# Patient Record
Sex: Male | Born: 1965 | Race: White | Hispanic: No | Marital: Married | Smoking: Current some day smoker
Health system: Southern US, Community
[De-identification: ages and names within clinical notes are randomized; demographics above are authoritative.]

## PROBLEM LIST (undated history)

## (undated) DIAGNOSIS — K611 Rectal abscess: Secondary | ICD-10-CM

## (undated) DIAGNOSIS — M1990 Unspecified osteoarthritis, unspecified site: Secondary | ICD-10-CM

## (undated) DIAGNOSIS — J189 Pneumonia, unspecified organism: Secondary | ICD-10-CM

## (undated) DIAGNOSIS — I10 Essential (primary) hypertension: Secondary | ICD-10-CM

## (undated) DIAGNOSIS — IMO0002 Reserved for concepts with insufficient information to code with codable children: Secondary | ICD-10-CM

## (undated) DIAGNOSIS — E785 Hyperlipidemia, unspecified: Secondary | ICD-10-CM

## (undated) DIAGNOSIS — I499 Cardiac arrhythmia, unspecified: Secondary | ICD-10-CM

## (undated) HISTORY — DX: Reserved for concepts with insufficient information to code with codable children: IMO0002

## (undated) HISTORY — PX: BACK SURGERY: SHX140

## (undated) HISTORY — DX: Rectal abscess: K61.1

## (undated) HISTORY — DX: Essential (primary) hypertension: I10

## (undated) HISTORY — PX: TONSILLECTOMY: SUR1361

## (undated) HISTORY — DX: Hyperlipidemia, unspecified: E78.5

---

## 1998-02-21 ENCOUNTER — Encounter: Admission: RE | Admit: 1998-02-21 | Discharge: 1998-03-13 | Payer: Self-pay | Admitting: Specialist

## 1998-03-23 ENCOUNTER — Encounter: Payer: Self-pay | Admitting: Specialist

## 1998-03-23 ENCOUNTER — Ambulatory Visit (HOSPITAL_COMMUNITY): Admission: RE | Admit: 1998-03-23 | Discharge: 1998-03-23 | Payer: Self-pay | Admitting: Specialist

## 1999-05-09 ENCOUNTER — Ambulatory Visit (HOSPITAL_COMMUNITY): Admission: RE | Admit: 1999-05-09 | Discharge: 1999-05-09 | Payer: Self-pay

## 2010-07-08 ENCOUNTER — Encounter (INDEPENDENT_AMBULATORY_CARE_PROVIDER_SITE_OTHER): Payer: Self-pay | Admitting: General Surgery

## 2010-07-08 ENCOUNTER — Ambulatory Visit (INDEPENDENT_AMBULATORY_CARE_PROVIDER_SITE_OTHER): Payer: 59 | Admitting: General Surgery

## 2010-07-08 DIAGNOSIS — L03319 Cellulitis of trunk, unspecified: Secondary | ICD-10-CM

## 2010-07-08 DIAGNOSIS — Z789 Other specified health status: Secondary | ICD-10-CM

## 2010-07-08 DIAGNOSIS — R0989 Other specified symptoms and signs involving the circulatory and respiratory systems: Secondary | ICD-10-CM | POA: Insufficient documentation

## 2010-07-08 DIAGNOSIS — K625 Hemorrhage of anus and rectum: Secondary | ICD-10-CM

## 2010-07-08 DIAGNOSIS — L02215 Cutaneous abscess of perineum: Secondary | ICD-10-CM

## 2010-07-08 DIAGNOSIS — I10 Essential (primary) hypertension: Secondary | ICD-10-CM | POA: Insufficient documentation

## 2010-07-08 DIAGNOSIS — Z973 Presence of spectacles and contact lenses: Secondary | ICD-10-CM

## 2010-07-08 DIAGNOSIS — I999 Unspecified disorder of circulatory system: Secondary | ICD-10-CM

## 2010-07-08 NOTE — Patient Instructions (Signed)
Warm tub soaks. Wash area and keep as clean and dry as possible. Take antibiotic twice a day.

## 2010-07-08 NOTE — Progress Notes (Signed)
Subjective:     Patient ID: Wesley Harrington, male   DOB: 1966/01/01, 45 y.o.   MRN: [id number]    BP 148/96  Pulse 72  Temp(Src) 97 F (36.1 C) (Temporal)  Ht 6\' 4"  (1.93 m)  Wt 269 lb (122.018 kg)  BMI 32.74 kg/m2    HPI Patient is a 45 yo Male who has been experiencing pain in his right perineal area since last Monday. No fever. Little drainage.  Review of Systems Past Medical History  Diagnosis Date  . Hypertension   History reviewed. No pertinent past surgical history. Current outpatient prescriptions:amLODipine (NORVASC) 5 MG tablet, Take 5 mg by mouth daily.  , Disp: , Rfl: ;  fish oil-omega-3 fatty acids 1000 MG capsule, Take 2 g by mouth daily.  , Disp: , Rfl: ;  Multiple Vitamin (MULTIVITAMIN) tablet, Take 1 tablet by mouth daily. Patient stated he is taking "provochol". Was not listed in database. , Disp: , Rfl:  Allergies  Allergen Reactions  . Sulfur Other (See Comments)    Dropped body temp. Felt cold.   History   Social History  . Marital Status: Single    Spouse Name: N/A    Number of Children: N/A  . Years of Education: N/A   Occupational History  . Not on file.   Social History Main Topics  . Smoking status: Current Everyday Smoker -- 1.5 packs/day  . Smokeless tobacco: Not on file  . Alcohol Use: Yes     socially 4 - 5 beers  . Drug Use: Yes    Special: Marijuana  . Sexually Active: Not on file   Other Topics Concern  . Not on file   Social History Narrative  . No narrative on file   Family History  Problem Relation Age of Onset  . Cancer Mother     skin  . Heart disease Father     congestive heart failure  . Other Brother     pacemaker       Objective:   Physical Exam He has a hard indurated area in his right perineal area. No redness. No drainage. No fluctuance. Exam otherwise neg.    Assessment:     Possible infection in right perineum.     Plan:     Start doxycycline. F/U in 1 week

## 2010-07-09 ENCOUNTER — Encounter (INDEPENDENT_AMBULATORY_CARE_PROVIDER_SITE_OTHER): Payer: Self-pay | Admitting: General Surgery

## 2010-07-17 ENCOUNTER — Ambulatory Visit (INDEPENDENT_AMBULATORY_CARE_PROVIDER_SITE_OTHER): Payer: 59 | Admitting: General Surgery

## 2010-07-17 ENCOUNTER — Encounter (INDEPENDENT_AMBULATORY_CARE_PROVIDER_SITE_OTHER): Payer: Self-pay | Admitting: General Surgery

## 2010-07-17 DIAGNOSIS — L02219 Cutaneous abscess of trunk, unspecified: Secondary | ICD-10-CM

## 2010-07-17 DIAGNOSIS — L02215 Cutaneous abscess of perineum: Secondary | ICD-10-CM

## 2010-07-17 NOTE — Progress Notes (Signed)
Subjective:     Patient ID: Wesley Harrington, male   DOB: October 15, 1965, 45 y.o.   MRN: [id number]    There were no vitals taken for this visit.    HPI The patient is a 45 her old white male who we have seen recently for any abscess in his right perineal area. He feels much better than he did his last visit. He has been on some doxycycline. He will intermittently get some drainage from the area. He denies any fevers. Review of Systems     Objective:   Physical Exam On exam he continues to have a palpable area of induration in his right perineal area headed towards the base of the scrotum. There is a punctate opening at the posterior edge of this. There is no drainage today.    Assessment:     Chronic sinus tract intermittently infected.    Plan:     I think this cycle will continue until the sinus tract is opened and drained more definitively. He would like to try to make it to a cooler part of the year because he is a heavy sweater. He is concerned about wound healing because of this. We will try to accommodate him. If he has another flareup though we may need to go ahead and open and drain it. I have discussed this with him including the risks of benefits of surgery as well as some of the technical aspects and he understands and would like to try to wait if he can. I will see him back in one month for a wound check.

## 2010-07-17 NOTE — Patient Instructions (Signed)
Keep area clean and dry Call if it flares up again F/U in 1 month Try to get to cooler time of year for surgery

## 2010-08-04 ENCOUNTER — Other Ambulatory Visit (INDEPENDENT_AMBULATORY_CARE_PROVIDER_SITE_OTHER): Payer: Self-pay | Admitting: General Surgery

## 2010-08-04 NOTE — Telephone Encounter (Signed)
fAX REQ RECEIVED FROM [facility]/ [phone] FOR DOXYCYCLINE  REFILL/ OK PER T.O. DR. P. TOTH/ [facility]

## 2010-08-20 ENCOUNTER — Encounter (INDEPENDENT_AMBULATORY_CARE_PROVIDER_SITE_OTHER): Payer: Self-pay | Admitting: General Surgery

## 2010-08-21 ENCOUNTER — Encounter (INDEPENDENT_AMBULATORY_CARE_PROVIDER_SITE_OTHER): Payer: 59 | Admitting: General Surgery

## 2010-10-06 ENCOUNTER — Encounter (INDEPENDENT_AMBULATORY_CARE_PROVIDER_SITE_OTHER): Payer: 59 | Admitting: General Surgery

## 2010-10-22 ENCOUNTER — Encounter (HOSPITAL_COMMUNITY)
Admission: RE | Admit: 2010-10-22 | Discharge: 2010-10-22 | Disposition: A | Discharge status: Home / Self Care | Payer: 59 | Source: Ambulatory Visit | Attending: General Surgery | Admitting: General Surgery

## 2010-10-22 ENCOUNTER — Other Ambulatory Visit (INDEPENDENT_AMBULATORY_CARE_PROVIDER_SITE_OTHER): Payer: Self-pay | Admitting: General Surgery

## 2010-10-22 DIAGNOSIS — K611 Rectal abscess: Secondary | ICD-10-CM

## 2010-10-22 LAB — BASIC METABOLIC PANEL
BUN: 14 mg/dL (ref 6–23)
CO2: 30 mEq/L (ref 19–32)
Calcium: 10.1 mg/dL (ref 8.4–10.5)
Chloride: 102 mEq/L (ref 96–112)
Creatinine, Ser: 1.01 mg/dL (ref 0.50–1.35)
GFR calc Af Amer: >90 mL/min (ref >90)
GFR calc non Af Amer: 87 mL/min — ABNORMAL LOW (ref >90)
Glucose, Bld: 109 mg/dL — ABNORMAL HIGH (ref 70–99)
Potassium: 4.9 mEq/L (ref 3.5–5.1)
Sodium: 141 mEq/L (ref 135–145)

## 2010-10-22 LAB — CBC
HCT: 44.8 % (ref 39.0–52.0)
Hemoglobin: 14.7 g/dL (ref 13.0–17.0)
MCH: 31.1 pg (ref 26.0–34.0)
MCHC: 32.8 g/dL (ref 30.0–36.0)
MCV: 94.9 fL (ref 78.0–100.0)
Platelets: 196 10*3/uL (ref 150–400)
RBC: 4.72 MIL/uL (ref 4.22–5.81)
RDW: 13.6 % (ref 11.5–15.5)
WBC: 7.6 10*3/uL (ref 4.0–10.5)

## 2010-10-22 LAB — SURGICAL PCR SCREEN: MRSA, PCR: NEGATIVE

## 2010-10-31 ENCOUNTER — Telehealth (INDEPENDENT_AMBULATORY_CARE_PROVIDER_SITE_OTHER): Payer: Self-pay | Admitting: General Surgery

## 2010-10-31 ENCOUNTER — Ambulatory Visit (HOSPITAL_COMMUNITY)
Admission: RE | Admit: 2010-10-31 | Discharge: 2010-10-31 | Disposition: A | Discharge status: Home / Self Care | Payer: 59 | Source: Ambulatory Visit | Attending: General Surgery | Admitting: General Surgery

## 2010-10-31 ENCOUNTER — Other Ambulatory Visit (INDEPENDENT_AMBULATORY_CARE_PROVIDER_SITE_OTHER): Payer: Self-pay | Admitting: General Surgery

## 2010-10-31 DIAGNOSIS — Z01818 Encounter for other preprocedural examination: Secondary | ICD-10-CM | POA: Insufficient documentation

## 2010-10-31 DIAGNOSIS — Z01812 Encounter for preprocedural laboratory examination: Secondary | ICD-10-CM | POA: Insufficient documentation

## 2010-10-31 DIAGNOSIS — K219 Gastro-esophageal reflux disease without esophagitis: Secondary | ICD-10-CM | POA: Insufficient documentation

## 2010-10-31 DIAGNOSIS — E669 Obesity, unspecified: Secondary | ICD-10-CM | POA: Insufficient documentation

## 2010-10-31 DIAGNOSIS — L02219 Cutaneous abscess of trunk, unspecified: Secondary | ICD-10-CM | POA: Insufficient documentation

## 2010-10-31 DIAGNOSIS — K612 Anorectal abscess: Secondary | ICD-10-CM

## 2010-10-31 DIAGNOSIS — I10 Essential (primary) hypertension: Secondary | ICD-10-CM | POA: Insufficient documentation

## 2010-10-31 DIAGNOSIS — Z01810 Encounter for preprocedural cardiovascular examination: Secondary | ICD-10-CM | POA: Insufficient documentation

## 2010-10-31 DIAGNOSIS — F17200 Nicotine dependence, unspecified, uncomplicated: Secondary | ICD-10-CM | POA: Insufficient documentation

## 2010-10-31 HISTORY — PX: INCISE AND DRAIN ABCESS: PRO64

## 2010-10-31 NOTE — Telephone Encounter (Signed)
Mrs. Cicero called and stated that Wesley Harrington was taking Tylox for pain after rectal surgery today and it made him itch.  I advised her to give him Benadryl and I called [facility] [address].  I also told her she could apply an ice pack to the area for 24 hours.

## 2010-11-02 ENCOUNTER — Telehealth (INDEPENDENT_AMBULATORY_CARE_PROVIDER_SITE_OTHER): Payer: Self-pay | Admitting: General Surgery

## 2010-11-02 NOTE — Telephone Encounter (Signed)
Mrs. Wesley Harrington called again.  Mr. Wesley Harrington is still having problems with pain.  I told her to add Ibuprofen to his regiman.

## 2010-11-03 ENCOUNTER — Telehealth (INDEPENDENT_AMBULATORY_CARE_PROVIDER_SITE_OTHER): Payer: Self-pay | Admitting: General Surgery

## 2010-11-03 ENCOUNTER — Telehealth (INDEPENDENT_AMBULATORY_CARE_PROVIDER_SITE_OTHER): Payer: Self-pay

## 2010-11-03 NOTE — Telephone Encounter (Signed)
Ok for tylox but he will probably have to come get prescription

## 2010-11-03 NOTE — Telephone Encounter (Signed)
Pt 's wife called in wanting to request home health care to be extended longer than the 5days that [facility] has allowed for the teaching of the dressing changes. I did advise that usually the health insurance is the one not allowing the home health to be out there longer than the family can be taught. I tried to explain to the pt's wife that as long as there is a family member to be taught in the home that [facility] will not go out there the whole entire time there is a wound. The pt' s wife tried to explain about her health insurance and what she is allowed but I told her that I would let her talk to Dr Billey Chang nurse. I transferred her to [employer].Hulda Humphrey

## 2010-11-03 NOTE — Telephone Encounter (Signed)
Pain medicine written per Dr Carolynne Edouard. #60. Patient to pick up in office.

## 2010-11-03 NOTE — Telephone Encounter (Signed)
Patient's wife called asking for an extension on her husbands skilled nursing visits for wound care. They are teaching her to do dressing changes but she feels like the wounds need skilled nursing. Last day for home health is Wednesday. Please advise.

## 2010-11-03 NOTE — Telephone Encounter (Signed)
Faxed request to extend visits for two more weeks to [facility]. Patient made aware.

## 2010-11-03 NOTE — Telephone Encounter (Signed)
i agree. Extend visits for a couple more weeks

## 2010-11-03 NOTE — Telephone Encounter (Signed)
Patient requesting refill of Tylox. I offered Vicodin per protocol but requesting Tylox. Please advise.

## 2010-11-10 ENCOUNTER — Ambulatory Visit (INDEPENDENT_AMBULATORY_CARE_PROVIDER_SITE_OTHER): Payer: 59 | Admitting: General Surgery

## 2010-11-10 ENCOUNTER — Encounter (INDEPENDENT_AMBULATORY_CARE_PROVIDER_SITE_OTHER): Payer: Self-pay | Admitting: General Surgery

## 2010-11-10 VITALS — BP 160/90 | HR 64 | Temp 98.2°F | Resp 12 | Ht 75.0 in | Wt 270.0 lb

## 2010-11-10 DIAGNOSIS — L02219 Cutaneous abscess of trunk, unspecified: Secondary | ICD-10-CM

## 2010-11-10 DIAGNOSIS — L02215 Cutaneous abscess of perineum: Secondary | ICD-10-CM | POA: Insufficient documentation

## 2010-11-10 NOTE — Patient Instructions (Signed)
Keep showering and changing dressing daily

## 2010-11-10 NOTE — Op Note (Signed)
  NAME:  Wesley Harrington, Wesley Harrington NO.:  [id number]  MEDICAL RECORD NO.:  [id number]  LOCATION:  [facility]                         FACILITY:  [facility]  PHYSICIAN:  Ollen Gross. Vernell Morgans, M.D. DATE OF BIRTH:  [birth year]  DATE OF PROCEDURE:  10/31/2010 DATE OF DISCHARGE:                              OPERATIVE REPORT   PREOPERATIVE DIAGNOSIS:  Perineal abscess tracks.  POSTOPERATIVE DIAGNOSIS:  Perineal abscess tracks.  PROCEDURE:  Incision and drainage of 3 perineal abscess tracks.  SURGEON:  Ollen Gross. Vernell Morgans, MD  ANESTHESIA:  General via LMA.  PROCEDURE:  After informed consent was obtained, the patient was brought to the operating room, placed in a supine position on the operating table.  After adequate induction of general anesthesia, the patient was moved into a lithotomy position.  His perineal area was then prepped with Betadine and draped in usual sterile manner.  The patient had 3 areas in question, 2 were on either side of the mid scrotum area, each of these were probed with a small silver probe until the abscess cavity was identified, the abscess cavity was then incised in a linear fashion. With the electrocautery, the purulence was evacuated.  The granulation tissue was fulgurated with the cautery until the wound was clean.  Each of these wounds were then infiltrated with 0.25% Marcaine and packed with gauze.  The third perineal tract started at the edge of the rectum, it was shallow, I went to the sphincter muscles and went up the right perineal area towards the base of the scrotum.  This was also probed with a small silver probe to identify the cavity.  The wound was then opened with a 15-blade knife and the electrocautery.  Very small amount of purulence was evacuated.  The granulation tissue was fulgurated with electrocautery.  Hemostasis was also achieved using the Bovie electrocautery.  Once the wound was clean, it was also infiltrated with 0.25% Marcaine and  packed with moistened 4 x 4 gauze.  Sterile dressings were then applied.  No other sinus tracts or abscess cavities were appreciated.  The patient tolerated the procedure well.  At the end of the case, all needle, sponge, and instrument counts were correct.  The patient was then awakened and taken to the recovery room in a stable condition.     Ollen Gross. Vernell Morgans, M.D.     PST/MEDQ  D:  10/31/2010  T:  10/31/2010  Job:  [id number]  Electronically Signed by Chevis Pretty III M.D. on 11/10/2010 08:11:40 AM

## 2010-11-11 ENCOUNTER — Encounter (INDEPENDENT_AMBULATORY_CARE_PROVIDER_SITE_OTHER): Payer: Self-pay | Admitting: General Surgery

## 2010-11-11 NOTE — Progress Notes (Signed)
Subjective:     Patient ID: Wesley Harrington, male   DOB: March 12, 1965, 45 y.o.   MRN: [id number]  HPI The patient is a 45 her white male who is now a couple weeks out from incision and drainage of 3 sinus tracts in his perineum. He appears very anxious and complains of some pain at the operative sites. He has been doing the dressing changes with the nurses and things seem to be going well  Review of Systems     Objective:   Physical Exam On exam all 3 wounds are open and very clean. We redressed the wound staying tolerated this well.    Assessment:     2 weeks status post incision and drainage of 3 sinus tracts in the perineum    Plan:     At this point I would like to continue to shower daily and irrigate the wounds. He'll continue daily dressing changes. We'll plan to see him back in another 2 weeks for a wound check

## 2010-11-17 ENCOUNTER — Telehealth (INDEPENDENT_AMBULATORY_CARE_PROVIDER_SITE_OTHER): Payer: Self-pay | Admitting: General Surgery

## 2010-11-17 NOTE — Telephone Encounter (Signed)
PT CALLED FOR REFILL OF TYLOX #60. DR/ TOTH WROTE RX AND PRESCRIPTION AT FRONT FOR PICK-UP. PT AWARE

## 2010-11-17 NOTE — Telephone Encounter (Signed)
PT CALLED FOR REFILL OF PAIN MEDICATION/ TYLOX. DR. Carolynne Edouard WROTE RX TODAY AND ORIGINAL AT FRONT DESK FOR PICK-UP. PT NOTIFIED

## 2010-11-24 ENCOUNTER — Encounter (INDEPENDENT_AMBULATORY_CARE_PROVIDER_SITE_OTHER): Payer: 59 | Admitting: General Surgery

## 2010-11-24 ENCOUNTER — Telehealth (INDEPENDENT_AMBULATORY_CARE_PROVIDER_SITE_OTHER): Payer: Self-pay

## 2010-11-24 NOTE — Telephone Encounter (Signed)
Pt called in concerned that he has a new area on his inner thigh that may be infected.  He has a red, swollen lump.  He had surgery in October for 3 areas and is coming in Wednesday to see Dr Carolynne Edouard.  He had been on Doxycycline for a while.  I paged Dr Carolynne Edouard and he wants him back on Doxycycline and he will check him on Wednesday.   I notified the pt and [facility] [phone] for 2 weeks.

## 2010-11-26 ENCOUNTER — Ambulatory Visit (INDEPENDENT_AMBULATORY_CARE_PROVIDER_SITE_OTHER): Payer: 59 | Admitting: General Surgery

## 2010-11-26 ENCOUNTER — Encounter (INDEPENDENT_AMBULATORY_CARE_PROVIDER_SITE_OTHER): Payer: Self-pay | Admitting: General Surgery

## 2010-11-26 VITALS — BP 144/102 | HR 68 | Temp 96.7°F | Resp 20 | Ht 72.0 in | Wt 264.1 lb

## 2010-11-26 DIAGNOSIS — L02215 Cutaneous abscess of perineum: Secondary | ICD-10-CM

## 2010-11-26 DIAGNOSIS — L03319 Cellulitis of trunk, unspecified: Secondary | ICD-10-CM

## 2010-11-26 MED ORDER — LINEZOLID 600 MG PO TABS: 600.0000 mg | ORAL_TABLET | Freq: Two times a day (BID) | ORAL | Status: AC

## 2010-11-26 NOTE — Patient Instructions (Signed)
Continue to shower and change dressing twice a day Start Zyvox

## 2010-11-27 ENCOUNTER — Encounter (INDEPENDENT_AMBULATORY_CARE_PROVIDER_SITE_OTHER): Payer: Self-pay | Admitting: General Surgery

## 2010-11-27 NOTE — Progress Notes (Signed)
Subjective:     Patient ID: Wesley Harrington, male   DOB: 1965/08/19, 45 y.o.   MRN: [id number]  HPI The patient is a 65 her a white male who is now about a month out from an incision and drainage of multiple perirectal abscesses. He has been doing dressing changes to the open areas. And he seemed to be improving. He still is on a lot of pain. Since his last visit he has started to develop a new area on the left inner thigh. This areas had no drainage. He denies any fevers or chills.  Review of Systems     Objective:   Physical Exam On exam of the 3 open areas are very clean and much more shallow than they have been. They are probably not deep enough to pack anymore. The new area is a little bit red with some induration.    Assessment:     One status post incision and drainage of multiple recurrent perirectal abscesses .    Plan:     At this point I would like him to continue to shower daily and place clean gauze over the wounds as needed. We started him on doxycycline for the new area over the last couple days. He has been on doxycycline quite a bit over the last year or so. We may try to switch him to something like Zyvox which would be potentially a better medicine against MRSA. We will see him back in 2 weeks.

## 2010-12-03 ENCOUNTER — Telehealth (INDEPENDENT_AMBULATORY_CARE_PROVIDER_SITE_OTHER): Payer: Self-pay | Admitting: General Surgery

## 2010-12-03 NOTE — Telephone Encounter (Signed)
MESSAGE RECEIVED IN IN-BASKET RE PT REQUEST FOR TYLOX REFILL FROM DR. TOTH. DR. Carolynne Edouard WROTE TYLOX #50 RX AND ORIGINAL AT FRONT DESK FOR PICK-UP. PT NOTIFIED.HE HAS F/U APPT WITH DR. TOTH ON 12-09-10

## 2010-12-03 NOTE — Telephone Encounter (Signed)
DR. Carolynne Edouard WROTE RX FOR TYLOX #50/ AT FRONT DESK FOR PT PICK-UP. PT NOTIFIED. HE HAS F/U WITH DR. TOTH ON 12-09-10.

## 2010-12-03 NOTE — Telephone Encounter (Signed)
Patient asking for a refill of his Tylox status post perineal abscess. Please advise.

## 2010-12-08 NOTE — Telephone Encounter (Signed)
He may have another prescription

## 2010-12-09 ENCOUNTER — Ambulatory Visit (INDEPENDENT_AMBULATORY_CARE_PROVIDER_SITE_OTHER): Payer: 59 | Admitting: General Surgery

## 2010-12-09 ENCOUNTER — Encounter (INDEPENDENT_AMBULATORY_CARE_PROVIDER_SITE_OTHER): Payer: Self-pay | Admitting: General Surgery

## 2010-12-09 VITALS — BP 136/88 | HR 76 | Temp 97.8°F | Resp 16 | Ht 75.0 in | Wt 267.8 lb

## 2010-12-09 DIAGNOSIS — L02215 Cutaneous abscess of perineum: Secondary | ICD-10-CM

## 2010-12-09 DIAGNOSIS — L02219 Cutaneous abscess of trunk, unspecified: Secondary | ICD-10-CM

## 2010-12-09 NOTE — Patient Instructions (Signed)
Continue to shower and keep area clean and dry

## 2010-12-09 NOTE — Progress Notes (Signed)
Subjective:     Patient ID: Wesley Harrington, male   DOB: 22-Feb-1965, 45 y.o.   MRN: [id number]  HPI The patient is a 26 her old white male who is several weeks out now from an incision and drainage of multiple perineal abscesses. He has been doing dressing changes to these areas and they all seem to be improving. He still complains of a lot of pain from the wound the nearest the rectum.  Review of Systems     Objective:   Physical Exam On exam all the wounds are very clean. They are all healing nicely. The 2 upper wounds are almost completely healed. The lower wound it is so much flatter and beginning to epithelialize.    Assessment:     Several weeks status post incision and drainage of multiple perineal abscesses    Plan:     At this point he will continue to keep the areas clean and dry. I think it would be fine for him to begin to return to work next week. We will see him back in about 3 weeks for another wound check

## 2010-12-23 ENCOUNTER — Ambulatory Visit (INDEPENDENT_AMBULATORY_CARE_PROVIDER_SITE_OTHER): Payer: 59 | Admitting: General Surgery

## 2010-12-23 ENCOUNTER — Encounter (INDEPENDENT_AMBULATORY_CARE_PROVIDER_SITE_OTHER): Payer: Self-pay | Admitting: General Surgery

## 2010-12-23 VITALS — BP 152/98 | HR 76 | Temp 97.7°F | Resp 16 | Ht 75.0 in | Wt 263.0 lb

## 2010-12-23 DIAGNOSIS — L02215 Cutaneous abscess of perineum: Secondary | ICD-10-CM

## 2010-12-23 DIAGNOSIS — L02219 Cutaneous abscess of trunk, unspecified: Secondary | ICD-10-CM

## 2010-12-23 NOTE — Patient Instructions (Signed)
Shower with hibiclens once a week

## 2010-12-30 ENCOUNTER — Encounter (INDEPENDENT_AMBULATORY_CARE_PROVIDER_SITE_OTHER): Payer: Self-pay | Admitting: General Surgery

## 2010-12-30 NOTE — Progress Notes (Signed)
Subjective:     Patient ID: Wesley Harrington, male   DOB: September 05, 1965, 45 y.o.   MRN: [id number]  HPI The patient's pain is gradually improving. He is several weeks out from incision and drainage of 3 perineal abscesses. He had a new spot, open his left groin area. It was very small and he has been on doxycycline for the last week or 2 and this area seems to have resolved.  Review of Systems     Objective:   Physical Exam On exam the wounds in his groin and perineal area have pretty much completely healed. There is no sign of infection    Assessment:     Several weeks out from incision and drainage of 3 perineal abscesses.    Plan:     Overall he looks good. I think at this point he should just keep these areas as clean as possible. We will plan to see him back in the next couple months just to check on his progress. He is to call if he has any problems in the meantime.

## 2011-01-27 ENCOUNTER — Encounter (INDEPENDENT_AMBULATORY_CARE_PROVIDER_SITE_OTHER): Payer: 59 | Admitting: General Surgery

## 2017-10-09 ENCOUNTER — Encounter (HOSPITAL_BASED_OUTPATIENT_CLINIC_OR_DEPARTMENT_OTHER): Payer: Self-pay | Admitting: Emergency Medicine

## 2017-10-09 ENCOUNTER — Emergency Department (HOSPITAL_BASED_OUTPATIENT_CLINIC_OR_DEPARTMENT_OTHER): Payer: 59

## 2017-10-09 ENCOUNTER — Other Ambulatory Visit: Payer: Self-pay

## 2017-10-09 ENCOUNTER — Emergency Department (HOSPITAL_BASED_OUTPATIENT_CLINIC_OR_DEPARTMENT_OTHER)
Admission: EM | Admit: 2017-10-09 | Discharge: 2017-10-09 | Disposition: A | Discharge status: Other Acute Inpatient Hospital | Payer: 59 | Attending: Emergency Medicine | Admitting: Emergency Medicine

## 2017-10-09 DIAGNOSIS — Z7902 Long term (current) use of antithrombotics/antiplatelets: Secondary | ICD-10-CM | POA: Insufficient documentation

## 2017-10-09 DIAGNOSIS — Z79899 Other long term (current) drug therapy: Secondary | ICD-10-CM | POA: Insufficient documentation

## 2017-10-09 DIAGNOSIS — K603 Anal fistula: Secondary | ICD-10-CM | POA: Insufficient documentation

## 2017-10-09 DIAGNOSIS — I10 Essential (primary) hypertension: Secondary | ICD-10-CM | POA: Diagnosis not present

## 2017-10-09 DIAGNOSIS — F17200 Nicotine dependence, unspecified, uncomplicated: Secondary | ICD-10-CM | POA: Diagnosis not present

## 2017-10-09 DIAGNOSIS — K610 Anal abscess: Secondary | ICD-10-CM | POA: Diagnosis not present

## 2017-10-09 DIAGNOSIS — Z7982 Long term (current) use of aspirin: Secondary | ICD-10-CM | POA: Diagnosis not present

## 2017-10-09 DIAGNOSIS — R509 Fever, unspecified: Secondary | ICD-10-CM | POA: Diagnosis present

## 2017-10-09 LAB — BASIC METABOLIC PANEL
ANION GAP: 12 (ref 5–15)
BUN: 11 mg/dL (ref 6–20)
CO2: 26 mmol/L (ref 22–32)
Calcium: 8.5 mg/dL — ABNORMAL LOW (ref 8.9–10.3)
Chloride: 97 mmol/L — ABNORMAL LOW (ref 98–111)
Creatinine, Ser: 1.13 mg/dL (ref 0.61–1.24)
GFR calc Af Amer: >60 mL/min (ref >60)
GFR calc non Af Amer: >60 mL/min (ref >60)
Glucose, Bld: 100 mg/dL — ABNORMAL HIGH (ref 70–99)
POTASSIUM: 3.6 mmol/L (ref 3.5–5.1)
Sodium: 135 mmol/L (ref 135–145)

## 2017-10-09 LAB — CBC WITH DIFFERENTIAL/PLATELET
BASOS ABS: 0 10*3/uL (ref 0.0–0.1)
BASOS PCT: 0 %
EOS ABS: 0.2 10*3/uL (ref 0.0–0.7)
EOS PCT: 1 %
HCT: 40.4 % (ref 39.0–52.0)
Hemoglobin: 13.4 g/dL (ref 13.0–17.0)
Lymphocytes Relative: 13 %
Lymphs Abs: 1.6 10*3/uL (ref 0.7–4.0)
MCH: 31.2 pg (ref 26.0–34.0)
MCHC: 33.2 g/dL (ref 30.0–36.0)
MCV: 94.2 fL (ref 78.0–100.0)
Monocytes Absolute: 1.3 10*3/uL — ABNORMAL HIGH (ref 0.1–1.0)
Monocytes Relative: 11 %
Neutro Abs: 9.3 10*3/uL — ABNORMAL HIGH (ref 1.7–7.7)
Neutrophils Relative %: 75 %
PLATELETS: 159 10*3/uL (ref 150–400)
RBC: 4.29 MIL/uL (ref 4.22–5.81)
RDW: 13.7 % (ref 11.5–15.5)
WBC: 12.3 10*3/uL — ABNORMAL HIGH (ref 4.0–10.5)

## 2017-10-09 LAB — I-STAT CG4 LACTIC ACID, ED: LACTIC ACID, VENOUS: 0.84 mmol/L (ref 0.5–1.9)

## 2017-10-09 MED ORDER — SODIUM CHLORIDE 0.9 % IV BOLUS
500.0000 mL | Freq: Once | INTRAVENOUS | Status: AC
  Administered 2017-10-09: 500 mL via INTRAVENOUS

## 2017-10-09 MED ORDER — MORPHINE SULFATE (PF) 4 MG/ML IV SOLN
4.0000 mg | Freq: Once | INTRAVENOUS | Status: AC
  Administered 2017-10-09: 4 mg via INTRAVENOUS
  Filled 2017-10-09: qty 1

## 2017-10-09 MED ORDER — SODIUM CHLORIDE 0.9 % IV BOLUS: 500.0000 mL | Freq: Once | INTRAVENOUS | Status: DC

## 2017-10-09 MED ORDER — PIPERACILLIN-TAZOBACTAM 3.375 G IVPB: 3.3750 g | Freq: Three times a day (TID) | INTRAVENOUS | Status: DC

## 2017-10-09 MED ORDER — PIPERACILLIN-TAZOBACTAM 3.375 G IVPB 30 MIN
3.3750 g | Freq: Once | INTRAVENOUS | Status: AC
  Administered 2017-10-09: 3.375 g via INTRAVENOUS
  Filled 2017-10-09 (×2): qty 50

## 2017-10-09 MED ORDER — MORPHINE SULFATE (PF) 2 MG/ML IV SOLN
2.0000 mg | Freq: Once | INTRAVENOUS | Status: AC
  Administered 2017-10-09: 2 mg via INTRAVENOUS
  Filled 2017-10-09: qty 1

## 2017-10-09 MED ORDER — IOPAMIDOL (ISOVUE-300) INJECTION 61%
100.0000 mL | Freq: Once | INTRAVENOUS | Status: AC | PRN
  Administered 2017-10-09: 100 mL via INTRAVENOUS

## 2017-10-09 NOTE — ED Triage Notes (Signed)
Patient states that he had an abscess to his back on Thursday - the patient has had a fever and generalized aches and pain prior to the procedure  - the patient states that he still has increased swelling and pain to his surgical site and continues to have fevers

## 2017-10-09 NOTE — ED Notes (Signed)
ED Provider at bedside. 

## 2017-10-09 NOTE — Progress Notes (Signed)
Pharmacy Antibiotic Note  Wesley Harrington is a 52 y.o. male admitted on 10/09/2017 with cellulitis. Pt has increased pain and swelling due to perirectal abscess. Pharmacy has been consulted for Zosyn dosing. WBC 12.3. SCr 1.13.   Plan: -Zosyn 3.375 gm IV Q 8 hours -Monitor CBC, renal fx, and clinical progress   Height: 6\' 3"  (190.5 cm) Weight: 256 lb (116.1 kg) IBW/kg (Calculated) : 84.5  Temp (24hrs), Avg:98.7 F (37.1 C), Min:98.4 F (36.9 C), Max:98.9 F (37.2 C)  Recent Labs  Lab 10/09/17 1627 10/09/17 1630  WBC 12.3*  --   CREATININE 1.13  --   LATICACIDVEN  --  0.84    Estimated Creatinine Clearance: 103.8 mL/min (by C-G formula based on SCr of 1.13 mg/dL).    Allergies  Allergen Reactions  . Sulfur Other (See Comments)    Dropped body temp. Felt cold.      Thank you for allowing pharmacy to be a part of this patient's care.  Vinnie Level, PharmD., BCPS Clinical Pharmacist Clinical phone for 10/09/17 until 8:30pm: [phone] If after 8:30pm, please refer to [facility] for unit-specific pharmacist

## 2017-10-09 NOTE — ED Notes (Signed)
Pt transferred via POV to [facility]. Pt ambulatory without difficulty to d/c window. Right AC PIV NSL and secured with kling for transfer. Pt advised to go directly to ED and to remain NPO until evaluated by receiving MD. Pt and spouse verbalized understanding. Pt's wife states she is driving

## 2017-10-09 NOTE — ED Notes (Signed)
Pt reports he had an abscess to left buttock drained in MD office on Thursday. States he was started on Augmentin. Reports he had a fever the day it was drained. Reports he has been running low grade temp at home and has been having chills and body aches. Now area that was drained is more swollen and painful. States he started antibiotics the day of the procedure

## 2017-10-09 NOTE — ED Provider Notes (Signed)
[facility] EMERGENCY DEPARTMENT Provider Note   CSN: [id number] Arrival date & time: 10/09/17  1504     History   Chief Complaint Chief Complaint  Patient presents with  . Post-op Problem    HPI Wesley Harrington is a 52 y.o. male presenting for increased pain and swelling due to perirectal abscess.  Patient reports that on Thursday he had an abscess of his left buttock drained by general surgeon Dr. Luan Pulling with [facility] surgical services and was started on Augmentin.  Patient states that since incision and drainage 2 days ago he has had increased pain, redness and swelling to the area despite adhering to antibiotic treatment.   Patient describes pain as a constant throbbing severe pain worse with palpation and movement of the area.  General surgeon, Dr. Luan Pulling note reviewed from 10/07/2017 shows that patient had I&D of the area and at that time there was induration and erythema to the left inferior medial buttock with central fluctuance.  No obvious palpable fluctuance on digital rectal exam at that time.  HPI  Past Medical History:  Diagnosis Date  . Cyst    groin cyst  . Hyperlipidemia   . Hypertension   . Perirectal abscess     Patient Active Problem List   Diagnosis Date Noted  . Abscess of perineum 11/10/2010  . High blood pressure 07/08/2010  . Poor circulation 07/08/2010  . Rectal bleeding/hemorrhoids 07/08/2010  . Wears glasses 07/08/2010    Past Surgical History:  Procedure Laterality Date  . INCISE AND DRAIN ABCESS  10/31/10   perirectal and thigh abscess   . TONSILLECTOMY          Home Medications    Prior to Admission medications   Medication Sig Start Date End Date Taking? Authorizing Provider  amoxicillin-clavulanate (AUGMENTIN) 875-125 MG tablet Take by mouth. 10/07/17 10/17/17 Yes [provider]  amLODipine (NORVASC) 5 MG tablet Take 5 mg by mouth daily.      [provider]  aspirin 81 MG chewable tablet Chew by  mouth.    [provider]  atorvastatin (LIPITOR) 10 MG tablet Take by mouth.    [provider]  fish oil-omega-3 fatty acids 1000 MG capsule Take 2 g by mouth daily.      [provider]  Multiple Vitamin (MULTIVITAMIN) tablet Take 1 tablet by mouth daily. Patient stated he is taking "provochol". Was not listed in database.     [provider]  pravastatin (PRAVACHOL) 20 MG tablet Take 20 mg by mouth daily.      [provider]    Family History Family History  Problem Relation Age of Onset  . Cancer Mother        skin  . Heart disease Father        congestive heart failure  . Other Brother        pacemaker    Social History Social History   Tobacco Use  . Smoking status: Current Every Day Smoker    Packs/day: 1.50  . Smokeless tobacco: Never Used  Substance Use Topics  . Alcohol use: Yes    Alcohol/week: 5.0 - 7.0 standard drinks    Types: 5 - 7 Cans of beer per week    Comment: socially 4 - 5 beers  . Drug use: Yes    Types: Marijuana     Allergies   Sulfur   Review of Systems Review of Systems  Constitutional: Positive for diaphoresis and fever. Negative  for chills.  HENT: Negative.  Negative for rhinorrhea and sore throat.   Eyes: Negative.  Negative for visual disturbance.  Respiratory: Negative.  Negative for cough and shortness of breath.   Cardiovascular: Negative.  Negative for chest pain.  Gastrointestinal: Positive for rectal pain. Negative for abdominal pain, blood in stool, diarrhea, nausea and vomiting.  Genitourinary: Negative.  Negative for dysuria and hematuria.  Musculoskeletal: Negative.  Negative for arthralgias and myalgias.  Skin: Positive for color change and wound.       Drainage  Neurological: Negative.  Negative for dizziness, weakness and headaches.   Physical Exam Updated Vital Signs BP 129/82 (BP Location: Left Arm)   Pulse 73   Temp 98.8 F (37.1 C) (Oral)   Resp 16   Ht 6\' 3"   (1.905 m)   Wt 116.1 kg   SpO2 98%   BMI 32.00 kg/m   Physical Exam  Constitutional: He is oriented to person, place, and time. He appears well-developed and well-nourished. No distress.  HENT:  Head: Normocephalic and atraumatic.  Right Ear: External ear normal.  Left Ear: External ear normal.  Nose: Nose normal.  Eyes: Pupils are equal, round, and reactive to light. EOM are normal.  Neck: Trachea normal and normal range of motion. No tracheal deviation present.  Cardiovascular: Normal rate, regular rhythm, normal heart sounds and intact distal pulses.  Pulses:      Dorsalis pedis pulses are 2+ on the right side, and 2+ on the left side.       Posterior tibial pulses are 2+ on the right side, and 2+ on the left side.  Pulmonary/Chest: Effort normal. No respiratory distress.  Abdominal: Soft. There is no tenderness. There is no rebound and no guarding.  Genitourinary:  Genitourinary Comments: Rectal exam chaperoned by Joss RN.  Patient with 1.5 cm incision to left inferomedial gluteal cleft.  Purulent drainage present.  Large surrounding area of erythema approximately.  7 cm x 5 cm with surrounding induration.  Area extends towards the rectum.  Internal rectal examination reveals small area of induration, patient states he has a history of internal hemorrhoids.  Unsure if this area is a hemorrhoid or extension of abscess.  Musculoskeletal: Normal range of motion.       Right ankle: Normal.       Left ankle: Normal.       Right lower leg: Normal.       Left lower leg: Normal.  Feet:  Right Foot:  Protective Sensation: 3 sites tested. 3 sites sensed.  Left Foot:  Protective Sensation: 3 sites tested. 3 sites sensed.  Neurological: He is alert and oriented to person, place, and time.  Skin: Skin is warm and dry.  Psychiatric: He has a normal mood and affect. His behavior is normal.    ED Treatments / Results  Labs (all labs ordered are listed, but only abnormal results are  displayed) Labs Reviewed  CBC WITH DIFFERENTIAL/PLATELET - Abnormal; Notable for the following components:      Result Value   WBC 12.3 (*)    Neutro Abs 9.3 (*)    Monocytes Absolute 1.3 (*)    All other components within normal limits  BASIC METABOLIC PANEL - Abnormal; Notable for the following components:   Chloride 97 (*)    Glucose, Bld 100 (*)    Calcium 8.5 (*)    All other components within normal limits  I-STAT CG4 LACTIC ACID, ED  I-STAT CG4 LACTIC ACID, ED  EKG None  Radiology Ct Pelvis W Contrast  Result Date: 10/09/2017 CLINICAL DATA:  Abscess EXAM: CT PELVIS WITH CONTRAST TECHNIQUE: Multidetector CT imaging of the pelvis was performed using the standard protocol following the bolus administration of intravenous contrast. CONTRAST:  ISOVUE-300 IOPAMIDOL (ISOVUE-300) INJECTION 61% COMPARISON:  09/19/2015 CT FINDINGS: Urinary Tract:  No abnormality visualized. Bowel:  Unremarkable visualized pelvic bowel loops. Vascular/Lymphatic: Moderate aortic atherosclerosis. No aneurysm. Prominent inguinal lymph nodes. Reproductive:  No mass or other significant abnormality Other: No significant pelvic effusion. Moderate edema within the subcutaneous fat of the left gluteal region. Curvilinear soft tissue thickening extending from the left perianal region towards the cutaneous surface of the left medial gluteal fold, possible fistula. No organized abscess is visualized. Musculoskeletal: Degenerative changes. No acute or suspicious abnormality. IMPRESSION: 1. Moderate edema within the subcutaneous fat of the left gluteal region consistent with cellulitis. Curvilinear soft tissue thickening extending from the left perianal region toward the cutaneous surface of the left medial gluteal fold, suggesting possible fistula. There is soft tissue thickening and inflammatory change here but no well-formed abscess or significant focal fluid collection. Electronically Signed   By: Jasmine Pang  M.D.   On: 10/09/2017 17:42    Procedures Procedures (including critical care time)  Medications Ordered in ED Medications  sodium chloride 0.9 % bolus 500 mL (500 mLs Intravenous Refused 10/09/17 1831)  piperacillin-tazobactam (ZOSYN) IVPB 3.375 g (has no administration in time range)  morphine 2 MG/ML injection 2 mg (has no administration in time range)  sodium chloride 0.9 % bolus 500 mL (0 mLs Intravenous Stopped 10/09/17 1803)  morphine 4 MG/ML injection 4 mg (4 mg Intravenous Given 10/09/17 1644)  iopamidol (ISOVUE-300) 61 % injection 100 mL (100 mLs Intravenous Contrast Given 10/09/17 1701)  morphine 2 MG/ML injection 2 mg (2 mg Intravenous Given 10/09/17 1828)  piperacillin-tazobactam (ZOSYN) IVPB 3.375 g (0 g Intravenous Stopped 10/09/17 1946)     Initial Impression / Assessment and Plan / ED Course  I have reviewed the triage vital signs and the nursing notes.  Pertinent labs & imaging results that were available during my care of the patient were reviewed by me and considered in my medical decision making (see chart for details).  Clinical Course as of Oct 09 1949  Sat Oct 09, 2017  1620 Rectal examination chaperoned by Joss RN.   [BM]  [phone] Consult called to Dr. Luster Landsberg with [facility]  surgical Associates.  Dr. Luster Landsberg has accepted the patient to his service. ER to ER transfer to [facility].  Recommended IV Zosyn.   [BM]  1900 Discussed with Care coordinator who has spoken to Dr. Luster Landsberg. [facility] is expecting patient POV to their ER.   [BM]    Clinical Course User Index [BM] Bill Salinas, PA-C   Patient presenting for worsening perirectal cellulitis post I&D of abscess 2 days ago.  Patient has been taking Augmentin.  Incision and drainage was done with [facility] surgical [facility]. CT imaging of the area shows possible fistula. IV fluids and pain control provided here in emergency department. Discussed case with [facility] general surgeon Dr.  Luster Landsberg who recommends transfer to [facility] so that he can evaluate the patient tonight.  Dr. Luster Landsberg also recommends a dose of IV Zosyn prior to transfer. IV Zosyn has been given. Patient and his wife have refused transport via [facility].  They wish to transfer by personal vehicle.  I have discussed the risks of transfer by  personal vehicle with the patient and family member.  Patient is alert and oriented x3 and has full mental capacity to make his own medical decisions and wishes to transport via personal vehicle.  Patient and family member have been informed that Dr. Luster Landsberg is expecting to see them at [facility] tonight. Patient and wife state understanding of care plan and will report to [facility] emergency department as soon as possible.  Care coordination has taken place and [facility] emergency department is expecting the patient.  Nursing staff has called report to ER.  Patient reevaluated prior to leaving our facility.  He is alert and oriented x3, resting comfortably and in no acute distress.  Patient is afebrile, not tachycardic, not hypotensive.  Patient is ambulatory in our facility without difficulty or assistance.  Lactic acid 0.84.  BMP nonacute.  CBC shows white blood cell count of 12.3.  Risks and benefits of transfer discussed with patient and his wife at length.  They state understanding and wished to proceed with personal vehicle transport to [facility] at this time.  Patient's wife states that she will be driving.  Patient appears stable for transfer at this time.  Case has been discussed with Dr. Rush Landmark who agrees with transfer of patient to accepting physician Dr. Luster Landsberg.    Note: Portions of this report may have been transcribed using voice recognition software. Every effort was made to ensure accuracy; however, inadvertent computerized transcription errors may still be present. Final  Clinical Impressions(s) / ED Diagnoses   Final diagnoses:  Perianal cellulitis  Perianal fistula    ED Discharge Orders    None       Elizabeth Palau 10/10/17 0050    Tegeler, Canary Brim, MD 10/10/17 [phone]

## 2018-11-14 ENCOUNTER — Encounter: Payer: Self-pay | Admitting: Gastroenterology

## 2019-07-16 IMAGING — CT CT PELVIS W/ CM
2 of 3 series · 16 of 46 positions shown, 18 images · IV contrast (iopamidol)
Comparison: 09/19/2015 CT

CLINICAL DATA: Abscess

EXAM:
CT PELVIS WITH CONTRAST
TECHNIQUE: Multidetector CT imaging of the pelvis was performed using the
standard protocol following the bolus administration of intravenous
contrast.
CONTRAST:  100mL H3YUJC-CJJ IOPAMIDOL (H3YUJC-CJJ) INJECTION 61%

[Series 5: axial soft tissue · axial · 0.88mm/px · z∈[-431,-97]mm · 13 of 193 slices shown, 15 images]
[im 13/193  soft-tissue]
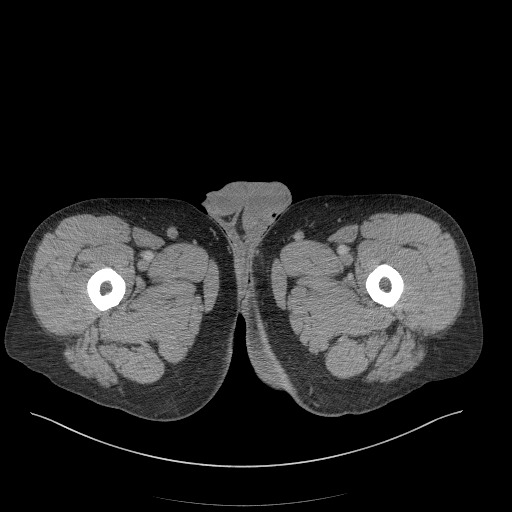
[im 13/193  bone]
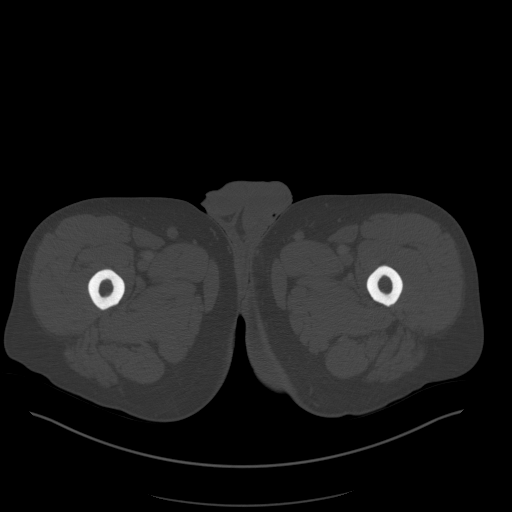
[im 25/193  soft-tissue]
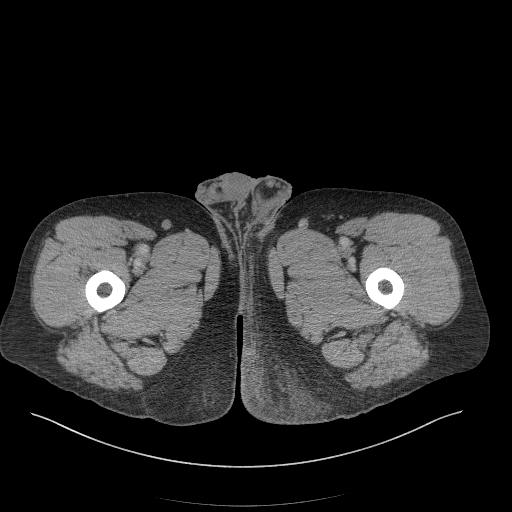
[im 38/193  soft-tissue]
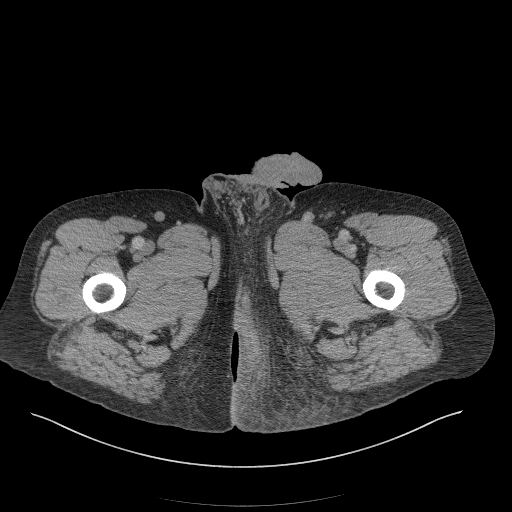
[im 56/193  soft-tissue]
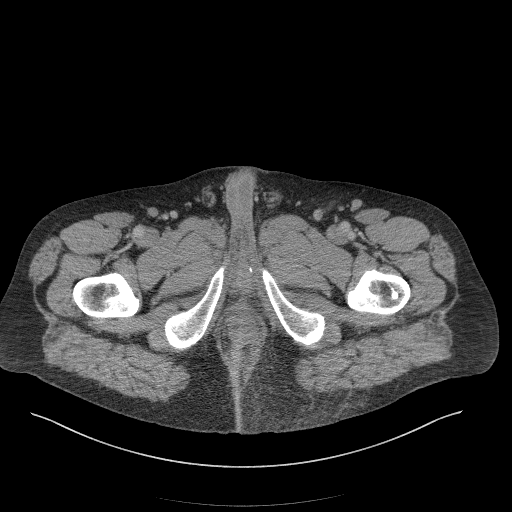
[im 69/193  soft-tissue]
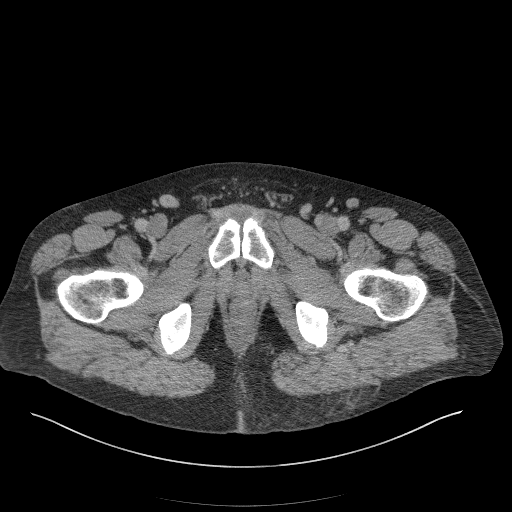
[im 81/193  soft-tissue]
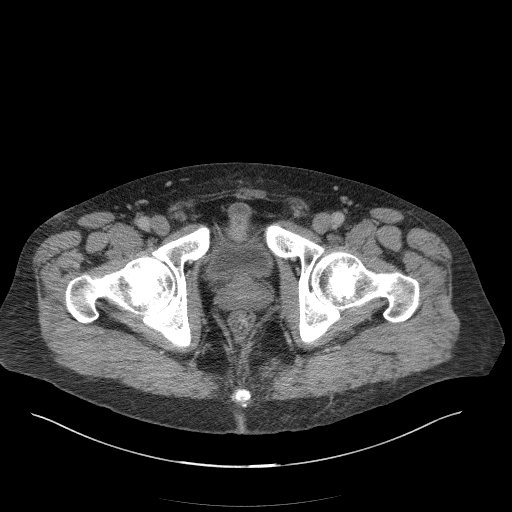
[im 100/193  soft-tissue]
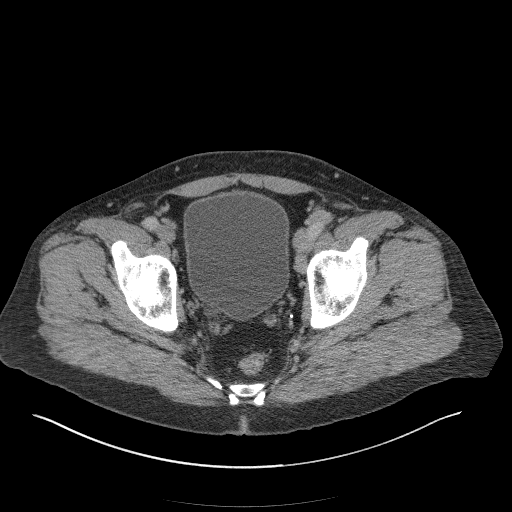
[im 112/193  soft-tissue]
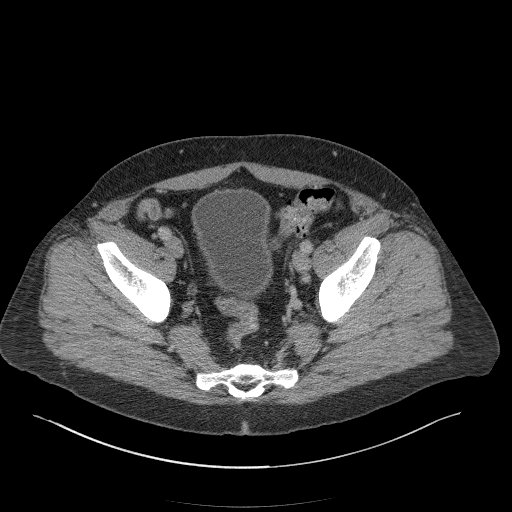
[im 124/193  soft-tissue]
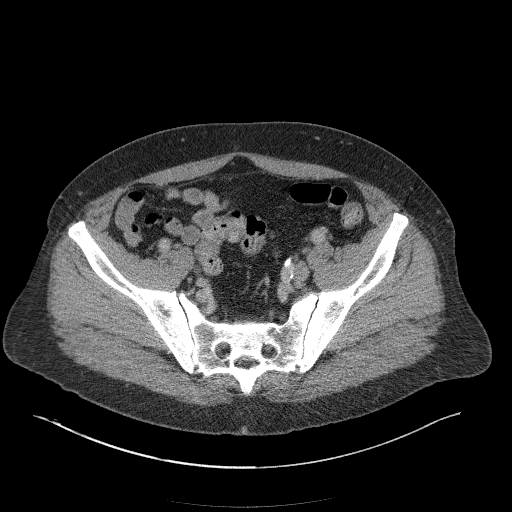
[im 124/193  bone]
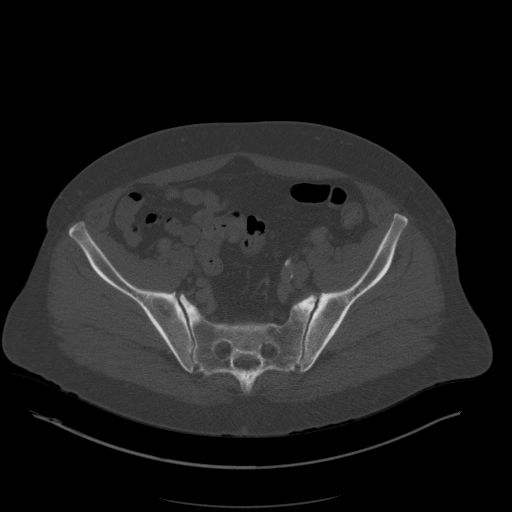
[im 137/193  soft-tissue]
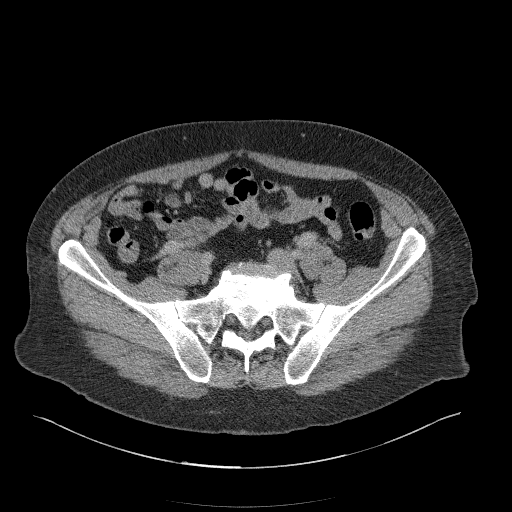
[im 155/193  soft-tissue]
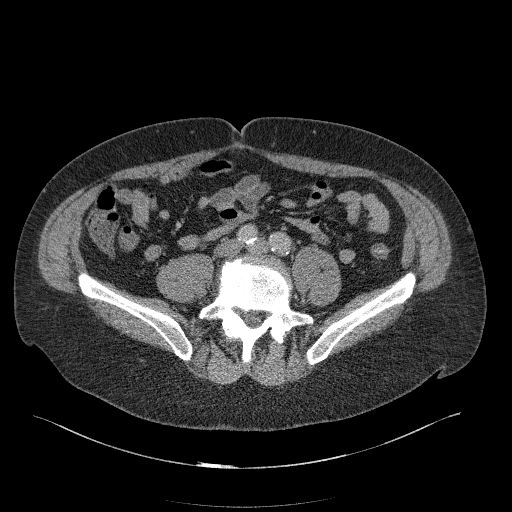
[im 168/193  soft-tissue]
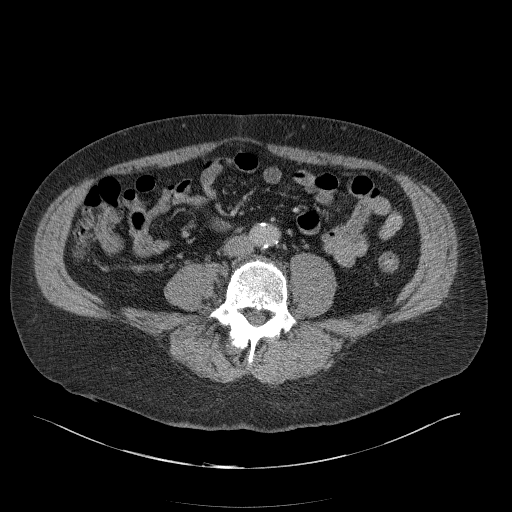
[im 180/193  soft-tissue]
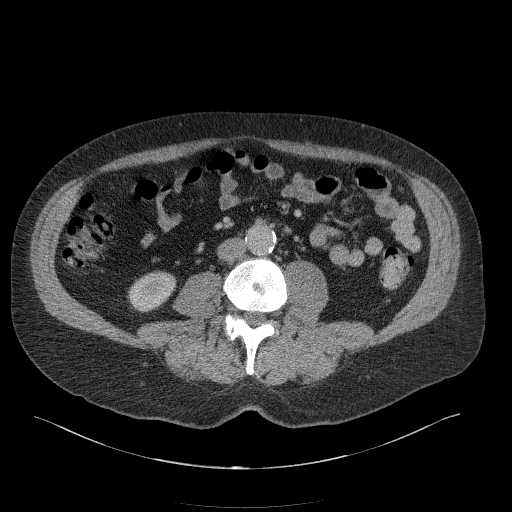

[Series 8: coronal st · coronal · 0.82mm/px · 3 of 122 slices shown]
[im 41/122  soft-tissue]
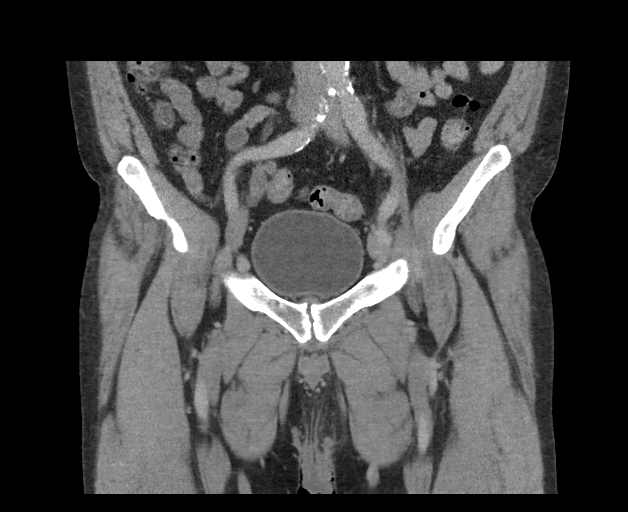
[im 54/122  soft-tissue]
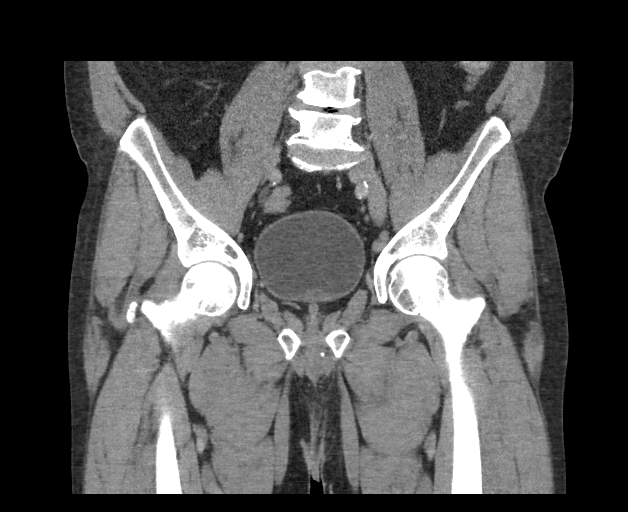
[im 68/122  soft-tissue]
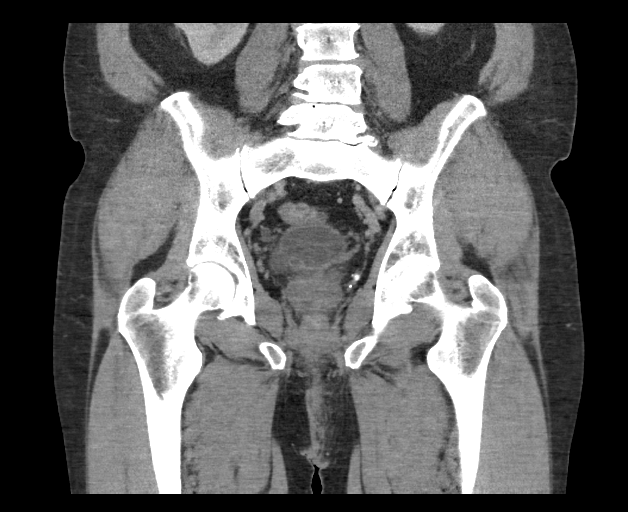

[16 of 46 positions shown; findings below may reference images not displayed]

FINDINGS: Urinary Tract:  No abnormality visualized.

Bowel:  Unremarkable visualized pelvic bowel loops.

Vascular/Lymphatic: Moderate aortic atherosclerosis. No aneurysm.
Prominent inguinal lymph nodes.

Reproductive:  No mass or other significant abnormality

Other: No significant pelvic effusion. Moderate edema within the
subcutaneous fat of the left gluteal region. Curvilinear soft tissue
thickening extending from the left perianal region towards the
cutaneous surface of the left medial gluteal fold, possible fistula.
No organized abscess is visualized.

Musculoskeletal: Degenerative changes. No acute or suspicious
abnormality.
IMPRESSION: 1. Moderate edema within the subcutaneous fat of the left gluteal
region consistent with cellulitis. Curvilinear soft tissue
thickening extending from the left perianal region toward the
cutaneous surface of the left medial gluteal fold, suggesting
possible fistula. There is soft tissue thickening and inflammatory
change here but no well-formed abscess or significant focal fluid
collection.

## 2021-08-13 ENCOUNTER — Ambulatory Visit: Payer: BC Managed Care – PPO | Admitting: Pulmonary Disease

## 2021-08-13 ENCOUNTER — Encounter: Payer: Self-pay | Admitting: Pulmonary Disease

## 2021-08-13 VITALS — BP 138/82 | HR 84 | Temp 98.5°F | Ht 75.0 in | Wt 249.6 lb

## 2021-08-13 DIAGNOSIS — R0609 Other forms of dyspnea: Secondary | ICD-10-CM

## 2021-08-13 MED ORDER — TRELEGY ELLIPTA 200-62.5-25 MCG/ACT IN AEPB: 1.0000 | INHALATION_SPRAY | Freq: Every day | RESPIRATORY_TRACT | 11 refills | Status: DC

## 2021-08-13 NOTE — Progress Notes (Signed)
@Patient  ID: , male    DOB: 1965/03/20, 56 y.o.   MRN: [id number]  Chief Complaint  Patient presents with   Consult    Pt is here for consult for bronchitis. Pt states that he stopped smoking in may and had some issues noted with breathing. He states he feels better on prednisone. Pt is on Albuterol as needed     Referring provider: [id number]., MD  HPI:   56 y.o. man whom are seen in consultation for evaluation of recurrent bronchitis.  Note from referring provider x4 reviewed.  Patient was in normal state health.  Quit smoking Mar 01, 2021.  Is following death of brother and mother.  Since then, has recurrent bronchitis.:  A severe productive cough, mild dyspnea on exertion.  Very severe in nature in terms of cough.  Productive of sputum.  First in 03/01/21.  Review of notes indicate since Feb 18, 2023until this month 3 courses of prednisone.  1 course of clindamycin.  Chest x-ray 05/2021 there in the midst of 1 of these bouts was clear per imaging report.  Prednisone greatly helping.  Albuterol helps as well.  He no longer needs albuterol for a few days of prednisone.  In between bouts, breathing is okay.  Cough is minimal.  Reviewed most recent chest x-ray 2012 lateral view reveals clear lungs, hyperinflated on the lateral view.  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  Lab Results: Personally reviewed CBC    Component Value Date/Time   WBC 12.3 (H) 10/09/2017 1627   RBC 4.29 10/09/2017 1627   HGB 13.4 10/09/2017 1627   HCT 40.4 10/09/2017 1627   PLT 159 10/09/2017 1627   MCV 94.2 10/09/2017 1627   MCH 31.2 10/09/2017 1627   MCHC 33.2 10/09/2017 1627   RDW 13.7 10/09/2017 1627   LYMPHSABS 1.6 10/09/2017 1627   MONOABS 1.3 (H) 10/09/2017 1627   EOSABS 0.2 10/09/2017 1627   BASOSABS 0.0 10/09/2017 1627    BMET    Component Value Date/Time   NA 135 10/09/2017 1627   K 3.6 10/09/2017 1627   CL 97 (L) 10/09/2017 1627   CO2 26 10/09/2017 1627   GLUCOSE 100 (H) 10/09/2017 1627   BUN 11 10/09/2017 1627   CREATININE 1.13 10/09/2017 1627   CALCIUM 8.5 (L) 10/09/2017 1627   GFRNONAA >60 10/09/2017 1627   GFRAA >60 10/09/2017 1627    BNP No results found for: "BNP"  ProBNP No results found for: "PROBNP"  Specialty Problems   None   Allergies  Allergen Reactions  Elemental Sulfur Other (See Comments)    Dropped body temp. Felt cold.   Sulfa Antibiotics Other (See Comments)    Immunization History  Administered Date(s) Administered   PFIZER(Purple Top)SARS-COV-2 Vaccination 04/18/2019, 05/09/2019    Past Medical History:  Diagnosis Date   Cyst    groin cyst   Hyperlipidemia    Hypertension    Perirectal abscess     Tobacco History: Social History   Tobacco Use  Smoking Status Former   Packs/day: 1.50   Years: 35.00   Total pack years: 52.50   Types: Cigarettes   Quit date: 05/15/2021   Years since quitting: 0.2  Smokeless Tobacco Never   Counseling given: Not Answered   Continue to not smoke  Outpatient Encounter Medications as of 08/13/2021  Medication Sig   albuterol (VENTOLIN HFA) 108 (90 Base) MCG/ACT inhaler Inhale 2 puffs into the lungs every 6 (six) hours as needed.   amLODipine (NORVASC) 5 MG tablet Take 5 mg by mouth daily.     aspirin 81 MG  chewable tablet Chew by mouth.   atorvastatin (LIPITOR) 10 MG tablet Take by mouth.   fish oil-omega-3 fatty acids 1000 MG capsule Take 2 g by mouth daily.     Fluticasone-Umeclidin-Vilant (TRELEGY ELLIPTA) 200-62.5-25 MCG/ACT AEPB Inhale 1 puff into the lungs daily.   Multiple Vitamin (MULTIVITAMIN) tablet Take 1 tablet by mouth daily. Patient stated he is taking "provochol". Was not listed in database.    pravastatin (PRAVACHOL) 20 MG tablet Take 20 mg by mouth daily.     No facility-administered encounter medications on file as of 08/13/2021.     Review of Systems  Review of Systems  No chest pain with exertion.  No orthopnea or PND.  Comprehensive review of systems otherwise negative Physical Exam  BP 138/82 (BP Location: Left Arm, Patient Position: Sitting, Cuff Size: Normal)   Pulse 84   Temp 98.5 F (36.9 C) (Oral)   Ht 6\' 3"  (1.905 m)   Wt 249 lb 9.6 oz (113.2 kg)   SpO2 94%   BMI 31.20 kg/m   Wt Readings from Last 5 Encounters:  08/13/21 249 lb 9.6 oz (113.2 kg)  10/09/17 256 lb (116.1 kg)  12/23/10 263 lb (119.3 kg)  12/09/10 267 lb 12.8 oz (121.5 kg)  11/26/10 264 lb 2 oz (119.8 kg)    BMI Readings from Last 5 Encounters:  08/13/21 31.20 kg/m  10/09/17 32.00 kg/m  12/23/10 32.87 kg/m  12/09/10 33.47 kg/m  11/26/10 35.82 kg/m     Physical Exam General: Sitting in chair, no acute distress Eyes: EOMI, no icterus Neck: Supple, no JVP Pulmonary: Faint wheeze scattered, otherwise clear, normal work of breathing Cardiovascular: Warm, no edema Abdomen: Nondistended, bowel sounds present MSK: No synovitis, no joint effusion Neuro: Normal gait, no weakness Psych: Normal mood, full affect  Assessment & Plan:   Recurrent bronchitis: Repeated issues with cough mild dyspnea improved with prednisone.  Essentially no to minimal respiratory symptoms in between episodes.  3 courses in the last 6 months.  Chest imaging clear 05/2021.  Given frequency, high suspicion  for asthma driven exacerbations.  Underlying smoking-related lung disease, COPD exacerbation is also considered.  Notably no PFTs to formally diagnose COPD.  Recommend high-dose Trelegy as regardless of etiology of exacerbations, triple inhaled therapy is warranted given the frequency and severity including frequent prednisone use.  Chest x-ray hyperinflated in 2012 so likely has some element of underlying obstructive lung disease.  Suspect worse after quitting smoking  which unfortunately can see progressive symptoms in the weeks or months after smoking cessation.  Tobacco abuse in remission: Quit 6 months ago or so.  Congratulated on this.  Consider enrollment in lung cancer screening at next visit when symptoms improved.  PFTs for evaluation of possible COPD.   Return in about 3 months (around 11/13/2021).   Karren Burly, MD 08/13/2021

## 2021-08-13 NOTE — Patient Instructions (Signed)
Nice to meet you  I think a daily inhaler with a steroid to help decrease inflammation and a long-acting version of albuterol to help keep the airways open will hopefully help you breathe easier on a day-to-day basis and as importantly decrease or eliminate the frequency of these bronchitis episodes.  Given the severity and multiple episodes over the last 6 months I recommend starting the strongest inhaler therapy we can.  Use Trelegy 1 puff daily every day.  Use it in the morning.  Rinse your mouth out with water after every use.  We will get pulmonary function tests at the time of your next office visit with me in 3 months  Return to clinic in 3 months with pulmonary function test same day, follow-up visit with Dr. Judeth Horn

## 2021-11-11 ENCOUNTER — Ambulatory Visit: Payer: BC Managed Care – PPO | Admitting: Pulmonary Disease

## 2022-01-13 NOTE — H&P (Signed)
Patient's anticipated LOS is less than 2 midnights, meeting these requirements: - Younger than 71 - Lives within 1 hour of care - Has a competent adult at home to recover with post-op recover - NO history of  - Chronic pain requiring opiods  - Diabetes  - Coronary Artery Disease  - Heart failure  - Heart attack  - Stroke  - DVT/VTE  - Cardiac arrhythmia  - Respiratory Failure/COPD  - Renal failure  - Anemia  - Advanced Liver disease     Wesley Harrington is an 57 y.o. male.    Chief Complaint: right shoulder pain  HPI: Pt is a 57 y.o. male complaining of right shoudler pain for multiple years. Pain had continually increased since the beginning. X-rays in the clinic show rotator cuff tear right shoulder. Pt has tried various conservative treatments which have failed to alleviate their symptoms, including injections and therapy. Various options are discussed with the patient. Risks, benefits and expectations were discussed with the patient. Patient understand the risks, benefits and expectations and wishes to proceed with surgery.   PCP:  [name], MD  D/C Plans: Home  PMH: Past Medical History:  Diagnosis Date   Cyst    groin cyst   Hyperlipidemia    Hypertension    Perirectal abscess     PSH: Past Surgical History:  Procedure Laterality Date   INCISE AND DRAIN ABCESS  10/31/10   perirectal and thigh abscess    TONSILLECTOMY      Social History:  reports that he quit smoking about 7 months ago. His smoking use included cigarettes. He has a 52.50 pack-year smoking history. He has never used smokeless tobacco. He reports current alcohol use of about 5.0 - 7.0 standard drinks of alcohol per week. He reports current drug use. Drug: Marijuana. BMI: Estimated body mass index is 31.2 kg/m as calculated from the following:   Height as of 08/13/21: 6\' 3"  (1.905 m).   Weight as of 08/13/21: 113.2 kg.  No results found for: "ALBUMIN" Diabetes: Patient does not have a  diagnosis of diabetes.     Smoking Status:      Allergies:  Allergies  Allergen Reactions   Elemental Sulfur Other (See Comments)    Dropped body temp. Felt cold.   Sulfa Antibiotics Other (See Comments)    Medications: No current facility-administered medications for this encounter.   Current Outpatient Medications  Medication Sig Dispense Refill   albuterol (VENTOLIN HFA) 108 (90 Base) MCG/ACT inhaler Inhale 2 puffs into the lungs every 6 (six) hours as needed.     amLODipine (NORVASC) 5 MG tablet Take 5 mg by mouth daily.       aspirin 81 MG chewable tablet Chew by mouth.     atorvastatin (LIPITOR) 10 MG tablet Take by mouth.     fish oil-omega-3 fatty acids 1000 MG capsule Take 2 g by mouth daily.       Fluticasone-Umeclidin-Vilant (TRELEGY ELLIPTA) 200-62.5-25 MCG/ACT AEPB Inhale 1 puff into the lungs daily. 60 each 11   Multiple Vitamin (MULTIVITAMIN) tablet Take 1 tablet by mouth daily. Patient stated he is taking "provochol". Was not listed in database.      pravastatin (PRAVACHOL) 20 MG tablet Take 20 mg by mouth daily.        No results found for this or any previous visit (from the past 48 hour(s)). No results found.  ROS: Pain with rom of the right upper extremity  Physical Exam: Alert and  oriented 57 y.o. male in no acute distress Cranial nerves 2-12 intact Cervical spine: full rom with no tenderness, nv intact distally Chest: active breath sounds bilaterally, no wheeze rhonchi or rales Heart: regular rate and rhythm, no murmur Abd: non tender non distended with active bowel sounds Hip is stable with rom  Right shoulder painful and weak rom Nv intact distally No rashes or edema distally  Assessment/Plan Assessment: right shoulder cuff tear and AC arthrosis  Plan:  Patient will undergo a right shoulder cuff repair by Dr. Veverly Fells at [place] Risks benefits and expectations were discussed with the patient. Patient understand risks, benefits and expectations  and wishes to proceed. Preoperative templating of the joint replacement has been completed, documented, and submitted to the Operating Room personnel in order to optimize intra-operative equipment management.   Merla Riches PA-C, MPAS [facility] is now [employer] [address] [place], [place], [place] [address] Phone: [phone] [email or web address] Facebook  [employer]

## 2022-01-22 NOTE — Patient Instructions (Addendum)
DUE TO COVID-19 ONLY TWO VISITORS  (aged 58 and older)  ARE ALLOWED TO COME WITH YOU AND STAY IN THE WAITING ROOM ONLY DURING PRE OP AND PROCEDURE.   **NO VISITORS ARE ALLOWED IN THE SHORT STAY AREA OR RECOVERY ROOM!!**  IF YOU WILL BE ADMITTED INTO THE HOSPITAL YOU ARE ALLOWED ONLY FOUR SUPPORT PEOPLE DURING VISITATION HOURS ONLY (7 AM -8PM)   The support person(s) must pass our screening, gel in and out, and wear a mask at all times, including in the patient's room. Patients must also wear a mask when staff or their support person are in the room. Visitors GUEST BADGE MUST BE WORN VISIBLY  One adult visitor may remain with you overnight and MUST be in the room by 8 P.M.     Your procedure is scheduled on: 01/30/22   Report to [facility] Main Entrance    Report to admitting at: 12:45 AM   Call this number if you have problems the morning of surgery [phone]   Do not eat food :After Midnight.   After Midnight you may have the following liquids until: 11:30 AM DAY OF SURGERY  Water Black Coffee (sugar ok, NO MILK/CREAM OR CREAMERS)  Tea (sugar ok, NO MILK/CREAM OR CREAMERS) regular and decaf                             Plain Jell-O (NO RED)                                           Fruit ices (not with fruit pulp, NO RED)                                     Popsicles (NO RED)                                                                  Juice: apple, WHITE grape, WHITE cranberry Sports drinks like Gatorade (NO RED)   The day of surgery:  Drink ONE (1) Pre-Surgery Clear Ensure or G2 at : 11:30 AM the morning of surgery. Drink in one sitting. Do not sip.  This drink was given to you during your hospital  pre-op appointment visit. Nothing else to drink after completing the  Pre-Surgery Clear Ensure or G2.          If you have questions, please contact your surgeon's office.  Oral Hygiene is also important to reduce your risk of infection.                                     Remember - BRUSH YOUR TEETH THE MORNING OF SURGERY WITH YOUR REGULAR TOOTHPASTE  DENTURES WILL BE REMOVED PRIOR TO SURGERY PLEASE DO NOT APPLY "Poly grip" OR ADHESIVES!!!   Do NOT smoke after Midnight   Take these medicines the morning of surgery with A SIP OF WATER: diltiazem,flecainide,esomeprazole,Claritin.Use inhalers as usual.Lorazepam as needed.Marland Kitchen  You may not have any metal on your body including hair pins, jewelry, and body piercing             Do not wear  lotions, powders, perfumes/cologne, or deodorant              Men may shave face and neck.   Do not bring valuables to the hospital. [facility] IS NOT             RESPONSIBLE   FOR VALUABLES.   Contacts, glasses, or bridgework may not be worn into surgery.   Bring small overnight bag day of surgery.   DO NOT BRING YOUR HOME MEDICATIONS TO THE HOSPITAL. PHARMACY WILL DISPENSE MEDICATIONS LISTED ON YOUR MEDICATION LIST TO YOU DURING YOUR ADMISSION IN THE HOSPITAL!    Patients discharged on the day of surgery will not be allowed to drive home.  Someone NEEDS to stay with you for the first 24 hours after anesthesia.   Special Instructions: Bring a copy of your healthcare power of attorney and living will documents         the day of surgery if you haven't scanned them before.              Please read over the following fact sheets you were given: IF YOU HAVE QUESTIONS ABOUT YOUR PRE-OP INSTRUCTIONS PLEASE CALL [phone]    [facility] - Preparing for Surgery Before surgery, you can play an important role.  Because skin is not sterile, your skin needs to be as free of germs as possible.  You can reduce the number of germs on your skin by washing with CHG (chlorahexidine gluconate) soap before surgery.  CHG is an antiseptic cleaner which kills germs and bonds with the skin to continue killing germs even after washing. Please DO NOT use if you have an allergy to CHG or antibacterial soaps.   If your skin becomes reddened/irritated stop using the CHG and inform your nurse when you arrive at Short Stay. Do not shave (including legs and underarms) for at least 48 hours prior to the first CHG shower.  You may shave your face/neck. Please follow these instructions carefully:  1.  Shower with CHG Soap the night before surgery and the  morning of Surgery.  2.  If you choose to wash your hair, wash your hair first as usual with your  normal  shampoo.  3.  After you shampoo, rinse your hair and body thoroughly to remove the  shampoo.                           4.  Use CHG as you would any other liquid soap.  You can apply chg directly  to the skin and wash                       Gently with a scrungie or clean washcloth.  5.  Apply the CHG Soap to your body ONLY FROM THE NECK DOWN.   Do not use on face/ open                           Wound or open sores. Avoid contact with eyes, ears mouth and genitals (private parts).                       Wash face,  Genitals (private parts) with  your normal soap.             6.  Wash thoroughly, paying special attention to the area where your surgery  will be performed.  7.  Thoroughly rinse your body with warm water from the neck down.  8.  DO NOT shower/wash with your normal soap after using and rinsing off  the CHG Soap.                9.  Pat yourself dry with a clean towel.            10.  Wear clean pajamas.            11.  Place clean sheets on your bed the night of your first shower and do not  sleep with pets. Day of Surgery : Do not apply any lotions/deodorants the morning of surgery.  Please wear clean clothes to the hospital/surgery center.  FAILURE TO FOLLOW THESE INSTRUCTIONS MAY RESULT IN THE CANCELLATION OF YOUR SURGERY PATIENT SIGNATURE_________________________________  NURSE SIGNATURE__________________________________  ________________________________________________________________________  Wesley Harrington  An incentive  spirometer is a tool that can help keep your lungs clear and active. This tool measures how well you are filling your lungs with each breath. Taking long deep breaths may help reverse or decrease the chance of developing breathing (pulmonary) problems (especially infection) following: A long period of time when you are unable to move or be active. BEFORE THE PROCEDURE  If the spirometer includes an indicator to show your best effort, your nurse or respiratory therapist will set it to a desired goal. If possible, sit up straight or lean slightly forward. Try not to slouch. Hold the incentive spirometer in an upright position. INSTRUCTIONS FOR USE  Sit on the edge of your bed if possible, or sit up as far as you can in bed or on a chair. Hold the incentive spirometer in an upright position. Breathe out normally. Place the mouthpiece in your mouth and seal your lips tightly around it. Breathe in slowly and as deeply as possible, raising the piston or the ball toward the top of the column. Hold your breath for 3-5 seconds or for as long as possible. Allow the piston or ball to fall to the bottom of the column. Remove the mouthpiece from your mouth and breathe out normally. Rest for a few seconds and repeat Steps 1 through 7 at least 10 times every 1-2 hours when you are awake. Take your time and take a few normal breaths between deep breaths. The spirometer may include an indicator to show your best effort. Use the indicator as a goal to work toward during each repetition. After each set of 10 deep breaths, practice coughing to be sure your lungs are clear. If you have an incision (the cut made at the time of surgery), support your incision when coughing by placing a pillow or rolled up towels firmly against it. Once you are able to get out of bed, walk around indoors and cough well. You may stop using the incentive spirometer when instructed by your caregiver.  RISKS AND COMPLICATIONS Take your time  so you do not get dizzy or light-headed. If you are in pain, you may need to take or ask for pain medication before doing incentive spirometry. It is harder to take a deep breath if you are having pain. AFTER USE Rest and breathe slowly and easily. It can be helpful to keep track of a log of your progress. Your caregiver  can provide you with a simple table to help with this. If you are using the spirometer at home, follow these instructions: [place] IF:  You are having difficultly using the spirometer. You have trouble using the spirometer as often as instructed. Your pain medication is not giving enough relief while using the spirometer. You develop fever of 100.5 F (38.1 C) or higher. SEEK IMMEDIATE MEDICAL CARE IF:  You cough up bloody sputum that had not been present before. You develop fever of 102 F (38.9 C) or greater. You develop worsening pain at or near the incision site. MAKE SURE YOU:  Understand these instructions. Will watch your condition. Will get help right away if you are not doing well or get worse. Document Released: 05/11/2006 Document Revised: 03/23/2011 Document Reviewed: 07/12/2006 ExitCare Patient Information 2014 ExitCare, [place].   ________________________________________________________________________ These are anesthesia recommendations for holding your anticoagulants.  Please contact your prescribing physician to confirm IF it is safe to hold your anticoagulants for this length of time.   Eliquis Apixaban   72 hours   Xarelto Rivaroxaban   72 hours  Plavix Clopidogrel   120 hours  Pletal Cilostazol   120 hours

## 2022-01-23 ENCOUNTER — Encounter (HOSPITAL_COMMUNITY)
Admission: RE | Admit: 2022-01-23 | Discharge: 2022-01-23 | Disposition: A | Discharge status: Home / Self Care | Payer: Worker's Compensation | Source: Ambulatory Visit | Attending: Orthopedic Surgery | Admitting: Orthopedic Surgery

## 2022-01-23 ENCOUNTER — Encounter (HOSPITAL_COMMUNITY): Payer: Self-pay

## 2022-01-23 ENCOUNTER — Other Ambulatory Visit: Payer: Self-pay

## 2022-01-23 VITALS — BP 181/112 | HR 78 | Temp 98.9°F | Ht 75.0 in | Wt 264.0 lb

## 2022-01-23 DIAGNOSIS — Z01812 Encounter for preprocedural laboratory examination: Secondary | ICD-10-CM | POA: Diagnosis present

## 2022-01-23 DIAGNOSIS — I10 Essential (primary) hypertension: Secondary | ICD-10-CM | POA: Diagnosis not present

## 2022-01-23 HISTORY — DX: Pneumonia, unspecified organism: J18.9

## 2022-01-23 HISTORY — DX: Cardiac arrhythmia, unspecified: I49.9

## 2022-01-23 HISTORY — DX: Unspecified osteoarthritis, unspecified site: M19.90

## 2022-01-23 LAB — BASIC METABOLIC PANEL
Anion gap: 9 (ref 5–15)
BUN: 12 mg/dL (ref 6–20)
CO2: 28 mmol/L (ref 22–32)
Calcium: 9.1 mg/dL (ref 8.9–10.3)
Chloride: 104 mmol/L (ref 98–111)
Creatinine, Ser: 1.26 mg/dL — ABNORMAL HIGH (ref 0.61–1.24)
GFR, Estimated: >60 mL/min (ref >60)
Glucose, Bld: 98 mg/dL (ref 70–99)
Potassium: 4.5 mmol/L (ref 3.5–5.1)
Sodium: 141 mmol/L (ref 135–145)

## 2022-01-23 LAB — CBC
HCT: 44.1 % (ref 39.0–52.0)
Hemoglobin: 14.3 g/dL (ref 13.0–17.0)
MCH: 31.4 pg (ref 26.0–34.0)
MCHC: 32.4 g/dL (ref 30.0–36.0)
MCV: 96.9 fL (ref 80.0–100.0)
Platelets: 201 10*3/uL (ref 150–400)
RBC: 4.55 MIL/uL (ref 4.22–5.81)
RDW: 13.6 % (ref 11.5–15.5)
WBC: 6.4 10*3/uL (ref 4.0–10.5)
nRBC: 0 % (ref 0.0–0.2)

## 2022-01-23 NOTE — Progress Notes (Addendum)
For Short Stay: [place] appointment date:  Bowel Prep reminder:   For Anesthesia: PCP - Dr. Bennett Scrape Cardiologist - Dr. Duayne Cal Clearance: Dr. Sandrea Matte: 11/04/21: chart. Chest x-ray -  EKG - 11/24/21: requested/Chart Stress Test -  ECHO - 11/21/21 Cardiac Cath -  Pacemaker/ICD device last checked: Pacemaker orders received: Device Rep notified:  Spinal Cord Stimulator:  Sleep Study -  CPAP -   Fasting Blood Sugar -  Checks Blood Sugar _____ times a day Date and result of last Hgb A1c-  Last dose of GLP1 agonist-  GLP1 instructions:   Last dose of SGLT-2 inhibitors-  SGLT-2 instructions:   Blood Thinner Instructions: No instructions yet,pt. Was advised to call cardiologist or surgeon to get instructions.A copy of pharmacy recommendations was give to pt.As per Dr. Lilli Few note,eliquis can be hold 3 days before surgery,pt. Was called and informed. Aspirin Instructions: Last Dose:  Activity level: Can go up a flight of stairs and activities of daily living without stopping and without chest pain and/or shortness of breath   Able to exercise without chest pain and/or shortness of breath    Anesthesia review: Hx> HTN,Afib. Pt. BP was high during PST: 183/111, 181/112. RN advised pt. To informed PCP About today's BP results,and to call back if there is any changes on his BP medications.  Patient denies shortness of breath, fever, cough and chest pain at PAT appointment   Patient verbalized understanding of instructions that were given to them at the PAT appointment. Patient was also instructed that they will need to review over the PAT instructions again at home before surgery.

## 2022-01-27 NOTE — Anesthesia Preprocedure Evaluation (Addendum)
Anesthesia Evaluation  Patient identified by MRN, date of birth, ID band Patient awake    Reviewed: Allergy & Precautions, H&P , NPO status , Patient's Chart, lab work & pertinent test results  Airway Mallampati: III  TM Distance: >3 FB Neck ROM: Full    Dental no notable dental hx. (+) Teeth Intact, Dental Advisory Given   Pulmonary Current Smoker   Pulmonary exam normal breath sounds clear to auscultation       Cardiovascular hypertension, Pt. on medications + dysrhythmias Atrial Fibrillation  Rhythm:Regular Rate:Normal     Neuro/Psych negative neurological ROS  negative psych ROS   GI/Hepatic negative GI ROS, Neg liver ROS,,,  Endo/Other  negative endocrine ROS    Renal/GU negative Renal ROS  negative genitourinary   Musculoskeletal  (+) Arthritis , Osteoarthritis,    Abdominal   Peds  Hematology negative hematology ROS (+)   Anesthesia Other Findings   Reproductive/Obstetrics negative OB ROS                             Anesthesia Physical Anesthesia Plan  ASA: 3  Anesthesia Plan: General   Post-op Pain Management: Regional block* and Tylenol PO (pre-op)*   Induction: Intravenous  PONV Risk Score and Plan: 2 and Ondansetron, Dexamethasone and Midazolam  Airway Management Planned: Oral ETT  Additional Equipment:   Intra-op Plan:   Post-operative Plan: Extubation in OR  Informed Consent: I have reviewed the patients History and Physical, chart, labs and discussed the procedure including the risks, benefits and alternatives for the proposed anesthesia with the patient or authorized representative who has indicated his/her understanding and acceptance.     Dental advisory given  Plan Discussed with: CRNA  Anesthesia Plan Comments: (See APP note by Durel Salts, FNP )       Anesthesia Quick Evaluation

## 2022-01-27 NOTE — Progress Notes (Addendum)
Anesthesia Chart Review:   Case: [id number] Date/Time: 01/30/22 1225   Procedure: SHOULDER ARTHROSCOPY WITH SUBACROMIAL DECOMPRESSION AND OPEN ROTATOR CUFF REPAIR, OPEN BICEPS TENDON REPAIR, DISTAL CLAVICLE RESECTION AND SUBSCAPULAR EVALUATION (Right) - CHOICE WITH INTERSCALENE BLOCK   Anesthesia type: Choice   Pre-op diagnosis: right shoulder rotator cuff tear   Location: [facility] ROOM 06 / [facility]   Surgeons: Netta Cedars, MD       DISCUSSION: Pt is 57 years old with hx HTN, atrial fibrillation  Pt to hold eliquis and ASA 3 days before surgery   BP elevated at presurgical testing. Pt instructed by RN to talk with his PCP.   VS: BP (!) 181/112   Pulse 78   Temp 37.2 C (Oral)   Ht 6\' 3"  (1.905 m)   Wt 119.7 kg   SpO2 94%   BMI 33.00 kg/m   PROVIDERS: - PCP is Sl[place]ky, Marshall Cork., MD - EP cardiology care by Demetrios Isaacs, NP. Last office visit 11/04/21 (notes in care everywhere). Cleared for surgery from EP cardiology standpoint by Dr. Sandrea Matte, MD   LABS: Labs reviewed: Acceptable for surgery. (all labs ordered are listed, but only abnormal results are displayed)  Labs Reviewed  BASIC METABOLIC PANEL - Abnormal; Notable for the following components:      Result Value   Creatinine, Ser 1.26 (*)    All other components within normal limits  CBC    EKG 11/04/21 (on paper chart): Sinus rhythm. Incomplete right bundle branch block   CV: Treadmill stress test 11/21/21 (care everywhere):  1. Normal resting EKG.  2.  Normal exercise tolerance.  Exaggerated BP response to exercise.  3. Normal exercise EKG with no induced arrhythmias and no evidence of ischemia.   Echo 11/21/21 (care everywhere): - 'Mildly' dilated ascending aorta. Recommend annual imaging.  - The left ventricular size is normal. There is mild concentric left ventricular hypertrophy with normal wall motion, normal systolic function and ejection fraction '50-55%'.  - Left ventricular diastolic function and  atrial pressure are indeterminate due to 'atrial fibrillation'  - The right ventricle is normal in size and function.  - The atria are normal in size.  - There is no significant valvular stenosis or regurgitation.  - RVSP not able to be calculated.  - IVC size was normal.  - The aortic sinus is mildly dilated [4.0cm].  - There is no significant change in comparison with the last study.    Past Medical History:  Diagnosis Date   Arthritis    Cyst    groin cyst   Dysrhythmia    Hyperlipidemia    Hypertension    Perirectal abscess    Pneumonia     Past Surgical History:  Procedure Laterality Date   BACK SURGERY     INCISE AND DRAIN ABCESS  10/31/2010   perirectal and thigh abscess    TONSILLECTOMY      MEDICATIONS:  albuterol (VENTOLIN HFA) 108 (90 Base) MCG/ACT inhaler   apixaban (ELIQUIS) 5 MG TABS tablet   aspirin EC 81 MG tablet   diltiazem (CARDIZEM CD) 240 MG 24 hr capsule   esomeprazole (NEXIUM) 20 MG capsule   ezetimibe (ZETIA) 10 MG tablet   flecainide (TAMBOCOR) 50 MG tablet   Fluticasone-Umeclidin-Vilant (TRELEGY ELLIPTA) 200-62.5-25 MCG/ACT AEPB   lisinopril (ZESTRIL) 20 MG tablet   loratadine (CLARITIN) 10 MG tablet   LORazepam (ATIVAN) 0.5 MG tablet   Multiple Vitamin (MULTIVITAMIN) tablet   oxyCODONE (ROXICODONE) 15 MG immediate release  tablet   testosterone cypionate (DEPOTESTOSTERONE CYPIONATE) 200 MG/ML injection   No current facility-administered medications for this encounter.    If BP acceptable day of surgery, I anticipate pt can proceed with surgery as scheduled.  Willeen Cass, PhD, FNP-BC [facility]/Anesthesiology Phone: [phone] 01/27/2022 10:57 AM

## 2022-01-30 ENCOUNTER — Observation Stay (HOSPITAL_COMMUNITY)
Admission: RE | Admit: 2022-01-30 | Discharge: 2022-01-31 | Disposition: A | Discharge status: Home / Self Care | Payer: Worker's Compensation | Attending: Orthopedic Surgery | Admitting: Orthopedic Surgery

## 2022-01-30 ENCOUNTER — Other Ambulatory Visit: Payer: Self-pay

## 2022-01-30 ENCOUNTER — Ambulatory Visit (HOSPITAL_COMMUNITY): Payer: Worker's Compensation | Admitting: Certified Registered"

## 2022-01-30 ENCOUNTER — Ambulatory Visit (HOSPITAL_COMMUNITY): Payer: Worker's Compensation | Admitting: Emergency Medicine

## 2022-01-30 ENCOUNTER — Encounter (HOSPITAL_COMMUNITY): Payer: Self-pay | Admitting: Orthopedic Surgery

## 2022-01-30 ENCOUNTER — Encounter (HOSPITAL_COMMUNITY)
Admission: RE | Disposition: A | Discharge status: Home / Self Care | Payer: Self-pay | Source: Home / Self Care | Attending: Orthopedic Surgery

## 2022-01-30 DIAGNOSIS — X58XXXA Exposure to other specified factors, initial encounter: Secondary | ICD-10-CM | POA: Insufficient documentation

## 2022-01-30 DIAGNOSIS — S43431A Superior glenoid labrum lesion of right shoulder, initial encounter: Secondary | ICD-10-CM | POA: Insufficient documentation

## 2022-01-30 DIAGNOSIS — Z9889 Other specified postprocedural states: Secondary | ICD-10-CM

## 2022-01-30 DIAGNOSIS — Z87891 Personal history of nicotine dependence: Secondary | ICD-10-CM | POA: Diagnosis not present

## 2022-01-30 DIAGNOSIS — Z7982 Long term (current) use of aspirin: Secondary | ICD-10-CM | POA: Insufficient documentation

## 2022-01-30 DIAGNOSIS — I10 Essential (primary) hypertension: Secondary | ICD-10-CM | POA: Diagnosis not present

## 2022-01-30 DIAGNOSIS — M19011 Primary osteoarthritis, right shoulder: Secondary | ICD-10-CM | POA: Diagnosis not present

## 2022-01-30 DIAGNOSIS — M75101 Unspecified rotator cuff tear or rupture of right shoulder, not specified as traumatic: Principal | ICD-10-CM | POA: Insufficient documentation

## 2022-01-30 DIAGNOSIS — Z79899 Other long term (current) drug therapy: Secondary | ICD-10-CM | POA: Diagnosis not present

## 2022-01-30 HISTORY — PX: SHOULDER ARTHROSCOPY WITH SUBACROMIAL DECOMPRESSION AND OPEN ROTATOR C: SHX5688

## 2022-01-30 SURGERY — SHOULDER ARTHROSCOPY WITH SUBACROMIAL DECOMPRESSION AND OPEN ROTATOR CUFF REPAIR, OPEN BICEPS TENDON REPAIR
Anesthesia: General | Laterality: Right

## 2022-01-30 MED ORDER — METHOCARBAMOL 500 MG IVPB - SIMPLE MED
500.0000 mg | Freq: Four times a day (QID) | INTRAVENOUS | Status: DC | PRN
  Administered 2022-01-30: 500 mg via INTRAVENOUS

## 2022-01-30 MED ORDER — MIDAZOLAM HCL 2 MG/2ML IJ SOLN
INTRAMUSCULAR | Status: DC | PRN
  Administered 2022-01-30: 2 mg via INTRAVENOUS

## 2022-01-30 MED ORDER — METHOCARBAMOL 500 MG IVPB - SIMPLE MED: 500.0000 mg | Freq: Once | INTRAVENOUS | Status: DC

## 2022-01-30 MED ORDER — FLUTICASONE FUROATE-VILANTEROL 200-25 MCG/ACT IN AEPB
1.0000 | INHALATION_SPRAY | Freq: Every day | RESPIRATORY_TRACT | Status: DC
  Administered 2022-01-31: 1 via RESPIRATORY_TRACT
  Filled 2022-01-30: qty 28

## 2022-01-30 MED ORDER — DEXAMETHASONE SODIUM PHOSPHATE 10 MG/ML IJ SOLN
INTRAMUSCULAR | Status: DC | PRN
  Administered 2022-01-30: 10 mg via INTRAVENOUS

## 2022-01-30 MED ORDER — EPINEPHRINE PF 1 MG/ML IJ SOLN
INTRAMUSCULAR | Status: AC
  Filled 2022-01-30: qty 1

## 2022-01-30 MED ORDER — PROPOFOL 10 MG/ML IV BOLUS
INTRAVENOUS | Status: DC | PRN
  Administered 2022-01-30: 250 mg via INTRAVENOUS

## 2022-01-30 MED ORDER — TESTOSTERONE CYPIONATE 200 MG/ML IM SOLN
200.0000 mg | INTRAMUSCULAR | Status: DC
  Filled 2022-01-30: qty 1

## 2022-01-30 MED ORDER — METHOCARBAMOL 500 MG PO TABS
500.0000 mg | ORAL_TABLET | Freq: Four times a day (QID) | ORAL | Status: DC | PRN
  Administered 2022-01-30 – 2022-01-31 (×3): 500 mg via ORAL
  Filled 2022-01-30 (×3): qty 1

## 2022-01-30 MED ORDER — METHOCARBAMOL 500 MG PO TABS: 500.0000 mg | ORAL_TABLET | Freq: Three times a day (TID) | ORAL | 1 refills | Status: AC | PRN

## 2022-01-30 MED ORDER — POLYETHYLENE GLYCOL 3350 17 G PO PACK
17.0000 g | PACK | Freq: Every day | ORAL | Status: DC | PRN
  Filled 2022-01-30: qty 1

## 2022-01-30 MED ORDER — DILTIAZEM HCL ER COATED BEADS 240 MG PO CP24
240.0000 mg | ORAL_CAPSULE | Freq: Every day | ORAL | Status: DC
  Administered 2022-01-31: 240 mg via ORAL
  Filled 2022-01-30: qty 1

## 2022-01-30 MED ORDER — FENTANYL CITRATE PF 50 MCG/ML IJ SOSY
PREFILLED_SYRINGE | INTRAMUSCULAR | Status: AC
  Filled 2022-01-30: qty 2

## 2022-01-30 MED ORDER — DOCUSATE SODIUM 100 MG PO CAPS
100.0000 mg | ORAL_CAPSULE | Freq: Two times a day (BID) | ORAL | Status: DC
  Administered 2022-01-30 – 2022-01-31 (×2): 100 mg via ORAL
  Filled 2022-01-30 (×2): qty 1

## 2022-01-30 MED ORDER — ONDANSETRON HCL 4 MG/2ML IJ SOLN
INTRAMUSCULAR | Status: DC | PRN
  Administered 2022-01-30: 4 mg via INTRAVENOUS

## 2022-01-30 MED ORDER — ONDANSETRON HCL 4 MG PO TABS: 4.0000 mg | ORAL_TABLET | Freq: Four times a day (QID) | ORAL | Status: DC | PRN

## 2022-01-30 MED ORDER — FENTANYL CITRATE (PF) 100 MCG/2ML IJ SOLN
INTRAMUSCULAR | Status: AC
  Filled 2022-01-30: qty 2

## 2022-01-30 MED ORDER — ACETAMINOPHEN 325 MG PO TABS
325.0000 mg | ORAL_TABLET | Freq: Four times a day (QID) | ORAL | Status: DC | PRN
  Filled 2022-01-30: qty 2

## 2022-01-30 MED ORDER — METOCLOPRAMIDE HCL 5 MG PO TABS: 5.0000 mg | ORAL_TABLET | Freq: Three times a day (TID) | ORAL | Status: DC | PRN

## 2022-01-30 MED ORDER — MIDAZOLAM HCL 2 MG/2ML IJ SOLN
INTRAMUSCULAR | Status: AC
  Filled 2022-01-30: qty 2

## 2022-01-30 MED ORDER — CHLORHEXIDINE GLUCONATE 0.12 % MT SOLN
15.0000 mL | Freq: Once | OROMUCOSAL | Status: AC
  Administered 2022-01-30: 15 mL via OROMUCOSAL

## 2022-01-30 MED ORDER — METOCLOPRAMIDE HCL 5 MG/ML IJ SOLN: 5.0000 mg | Freq: Three times a day (TID) | INTRAMUSCULAR | Status: DC | PRN

## 2022-01-30 MED ORDER — HYDROMORPHONE HCL 1 MG/ML IJ SOLN
INTRAMUSCULAR | Status: AC
  Administered 2022-01-30: 0.5 mg via INTRAVENOUS
  Filled 2022-01-30: qty 1

## 2022-01-30 MED ORDER — BUPIVACAINE-EPINEPHRINE 0.25% -1:200000 IJ SOLN
INTRAMUSCULAR | Status: DC | PRN
  Administered 2022-01-30: 18 mL

## 2022-01-30 MED ORDER — PHENOL 1.4 % MT LIQD: 1.0000 | OROMUCOSAL | Status: DC | PRN

## 2022-01-30 MED ORDER — LORAZEPAM 0.5 MG PO TABS
0.5000 mg | ORAL_TABLET | Freq: Three times a day (TID) | ORAL | Status: DC | PRN
  Administered 2022-01-30 – 2022-01-31 (×3): 0.5 mg via ORAL
  Filled 2022-01-30 (×3): qty 1

## 2022-01-30 MED ORDER — ORAL CARE MOUTH RINSE: 15.0000 mL | Freq: Once | OROMUCOSAL | Status: AC

## 2022-01-30 MED ORDER — ONDANSETRON HCL 4 MG/2ML IJ SOLN: 4.0000 mg | Freq: Four times a day (QID) | INTRAMUSCULAR | Status: DC | PRN

## 2022-01-30 MED ORDER — EPHEDRINE SULFATE-NACL 50-0.9 MG/10ML-% IV SOSY
PREFILLED_SYRINGE | INTRAVENOUS | Status: DC | PRN
  Administered 2022-01-30: 5 mg via INTRAVENOUS
  Administered 2022-01-30: 10 mg via INTRAVENOUS
  Administered 2022-01-30 (×2): 5 mg via INTRAVENOUS

## 2022-01-30 MED ORDER — CEFAZOLIN SODIUM-DEXTROSE 2-4 GM/100ML-% IV SOLN
2.0000 g | Freq: Four times a day (QID) | INTRAVENOUS | Status: AC
  Administered 2022-01-30 – 2022-01-31 (×3): 2 g via INTRAVENOUS
  Filled 2022-01-30 (×3): qty 100

## 2022-01-30 MED ORDER — ACETAMINOPHEN 500 MG PO TABS
1000.0000 mg | ORAL_TABLET | Freq: Once | ORAL | Status: AC
  Administered 2022-01-30: 1000 mg via ORAL
  Filled 2022-01-30: qty 2

## 2022-01-30 MED ORDER — HYDROMORPHONE HCL 1 MG/ML IJ SOLN
0.5000 mg | INTRAMUSCULAR | Status: DC | PRN
  Administered 2022-01-30 – 2022-01-31 (×3): 1 mg via INTRAVENOUS
  Filled 2022-01-30 (×3): qty 1

## 2022-01-30 MED ORDER — FENTANYL CITRATE (PF) 100 MCG/2ML IJ SOLN
INTRAMUSCULAR | Status: DC | PRN
  Administered 2022-01-30: 25 ug via INTRAVENOUS
  Administered 2022-01-30: 50 ug via INTRAVENOUS

## 2022-01-30 MED ORDER — LISINOPRIL 20 MG PO TABS
20.0000 mg | ORAL_TABLET | Freq: Every day | ORAL | Status: DC
  Administered 2022-01-31: 20 mg via ORAL
  Filled 2022-01-30: qty 1

## 2022-01-30 MED ORDER — EMERGEN-C VITAMIN C PO CHEW: 2.0000 | CHEWABLE_TABLET | Freq: Two times a day (BID) | ORAL | Status: DC

## 2022-01-30 MED ORDER — LORATADINE 10 MG PO TABS
10.0000 mg | ORAL_TABLET | Freq: Every day | ORAL | Status: DC
  Administered 2022-01-31: 10 mg via ORAL
  Filled 2022-01-30: qty 1

## 2022-01-30 MED ORDER — LIDOCAINE 2% (20 MG/ML) 5 ML SYRINGE
INTRAMUSCULAR | Status: DC | PRN
  Administered 2022-01-30: 60 mg via INTRAVENOUS
  Administered 2022-01-30: 40 mg via INTRAVENOUS

## 2022-01-30 MED ORDER — PROPOFOL 10 MG/ML IV BOLUS
INTRAVENOUS | Status: AC
  Filled 2022-01-30: qty 20

## 2022-01-30 MED ORDER — SUCCINYLCHOLINE CHLORIDE 200 MG/10ML IV SOSY
PREFILLED_SYRINGE | INTRAVENOUS | Status: DC | PRN
  Administered 2022-01-30: 140 mg via INTRAVENOUS

## 2022-01-30 MED ORDER — LACTATED RINGERS IV SOLN: INTRAVENOUS | Status: DC

## 2022-01-30 MED ORDER — HYDROMORPHONE HCL 1 MG/ML IJ SOLN
0.2500 mg | INTRAMUSCULAR | Status: DC | PRN
  Administered 2022-01-30: 0.5 mg via INTRAVENOUS

## 2022-01-30 MED ORDER — EZETIMIBE 10 MG PO TABS
10.0000 mg | ORAL_TABLET | Freq: Every day | ORAL | Status: DC
  Administered 2022-01-31: 10 mg via ORAL
  Filled 2022-01-30: qty 1

## 2022-01-30 MED ORDER — BUPIVACAINE-EPINEPHRINE (PF) 0.5% -1:200000 IJ SOLN
INTRAMUSCULAR | Status: DC | PRN
  Administered 2022-01-30 (×2): 20 mL via PERINEURAL

## 2022-01-30 MED ORDER — ALBUTEROL SULFATE (2.5 MG/3ML) 0.083% IN NEBU: 2.5000 mg | INHALATION_SOLUTION | Freq: Four times a day (QID) | RESPIRATORY_TRACT | Status: DC | PRN

## 2022-01-30 MED ORDER — FENTANYL CITRATE PF 50 MCG/ML IJ SOSY
25.0000 ug | PREFILLED_SYRINGE | Freq: Once | INTRAMUSCULAR | Status: AC
  Administered 2022-01-30: 100 ug via INTRAVENOUS

## 2022-01-30 MED ORDER — ALBUTEROL SULFATE HFA 108 (90 BASE) MCG/ACT IN AERS: 2.0000 | INHALATION_SPRAY | Freq: Four times a day (QID) | RESPIRATORY_TRACT | Status: DC | PRN

## 2022-01-30 MED ORDER — ADULT MULTIVITAMIN W/MINERALS CH
1.0000 | ORAL_TABLET | Freq: Every day | ORAL | Status: DC
  Administered 2022-01-31: 1 via ORAL
  Filled 2022-01-30: qty 1

## 2022-01-30 MED ORDER — UMECLIDINIUM BROMIDE 62.5 MCG/ACT IN AEPB
1.0000 | INHALATION_SPRAY | Freq: Every day | RESPIRATORY_TRACT | Status: DC
  Administered 2022-01-31: 1 via RESPIRATORY_TRACT
  Filled 2022-01-30: qty 7

## 2022-01-30 MED ORDER — METHOCARBAMOL 500 MG IVPB - SIMPLE MED
INTRAVENOUS | Status: AC
  Filled 2022-01-30: qty 55

## 2022-01-30 MED ORDER — OXYCODONE HCL 5 MG PO TABS
15.0000 mg | ORAL_TABLET | Freq: Four times a day (QID) | ORAL | Status: DC | PRN
  Administered 2022-01-30 – 2022-01-31 (×3): 15 mg via ORAL
  Filled 2022-01-30 (×3): qty 3

## 2022-01-30 MED ORDER — FLECAINIDE ACETATE 50 MG PO TABS
50.0000 mg | ORAL_TABLET | Freq: Two times a day (BID) | ORAL | Status: DC
  Administered 2022-01-30 – 2022-01-31 (×2): 50 mg via ORAL
  Filled 2022-01-30 (×2): qty 1

## 2022-01-30 MED ORDER — 0.9 % SODIUM CHLORIDE (POUR BTL) OPTIME
TOPICAL | Status: DC | PRN
  Administered 2022-01-30: 1000 mL

## 2022-01-30 MED ORDER — BUPIVACAINE HCL (PF) 0.25 % IJ SOLN
INTRAMUSCULAR | Status: AC
  Filled 2022-01-30: qty 30

## 2022-01-30 MED ORDER — SODIUM CHLORIDE 0.9 % IV SOLN: INTRAVENOUS | Status: DC

## 2022-01-30 MED ORDER — BUPIVACAINE LIPOSOME 1.3 % IJ SUSP
INTRAMUSCULAR | Status: DC | PRN
  Administered 2022-01-30: 10 mL via PERINEURAL

## 2022-01-30 MED ORDER — APIXABAN 5 MG PO TABS
5.0000 mg | ORAL_TABLET | Freq: Two times a day (BID) | ORAL | Status: DC
  Filled 2022-01-30: qty 1

## 2022-01-30 MED ORDER — CEFAZOLIN SODIUM-DEXTROSE 2-4 GM/100ML-% IV SOLN
2.0000 g | INTRAVENOUS | Status: AC
  Administered 2022-01-30: 2 g via INTRAVENOUS
  Filled 2022-01-30: qty 100

## 2022-01-30 MED ORDER — PANTOPRAZOLE SODIUM 40 MG PO TBEC
40.0000 mg | DELAYED_RELEASE_TABLET | Freq: Every day | ORAL | Status: DC
  Administered 2022-01-31: 40 mg via ORAL
  Filled 2022-01-30: qty 1

## 2022-01-30 MED ORDER — MENTHOL 3 MG MT LOZG: 1.0000 | LOZENGE | OROMUCOSAL | Status: DC | PRN

## 2022-01-30 MED ORDER — PHENYLEPHRINE HCL-NACL 20-0.9 MG/250ML-% IV SOLN
INTRAVENOUS | Status: DC | PRN
  Administered 2022-01-30: 100 ug/min via INTRAVENOUS

## 2022-01-30 MED ORDER — MIDAZOLAM HCL 2 MG/2ML IJ SOLN
0.5000 mg | Freq: Once | INTRAMUSCULAR | Status: AC
  Administered 2022-01-30: 2 mg via INTRAVENOUS

## 2022-01-30 MED ORDER — ASPIRIN 81 MG PO TBEC
81.0000 mg | DELAYED_RELEASE_TABLET | Freq: Every day | ORAL | Status: DC
  Filled 2022-01-30: qty 1

## 2022-01-30 SURGICAL SUPPLY — 72 items
AID PSTN UNV HD RSTRNT DISP (MISCELLANEOUS) ×1
ANCH SUT 1.3 2 RBN BLU WHT (Anchor) ×1 IMPLANT
ANCH SUT 2 1.3X1 LD 1 STRN (Anchor) ×1 IMPLANT
ANCHOR ALL SUT RC W2 1.3 RIB (Anchor) ×1 IMPLANT
ANCHOR ALL-SUT FLEX 1.3 Y-KNOT (Anchor) IMPLANT
ANCHOR ALL-SUT RC W2 1.3 RIB (Anchor) IMPLANT
APL SKNCLS STERI-STRIP NONHPOA (GAUZE/BANDAGES/DRESSINGS)
BAG COUNTER SPONGE SURGICOUNT (BAG) IMPLANT
BAG SPNG CNTER NS LX DISP (BAG)
BENZOIN TINCTURE PRP APPL 2/3 (GAUZE/BANDAGES/DRESSINGS) IMPLANT
BIT DRILL 1.3M DISPOSABLE (BIT) IMPLANT
BLADE AVERAGE 25X9 (BLADE) IMPLANT
BLADE CLIPPER SURG (BLADE) IMPLANT
BLADE EXCALIBUR 4.0X13 (MISCELLANEOUS) IMPLANT
BLADE SURG 15 STRL LF DISP TIS (BLADE) ×1 IMPLANT
BLADE SURG 15 STRL SS (BLADE) ×1
BLADE SURG SZ10 CARB STEEL (BLADE) ×1 IMPLANT
BURR OVAL 8 FLU 4.0X13 (MISCELLANEOUS) ×1 IMPLANT
CLSR STERI-STRIP ANTIMIC 1/2X4 (GAUZE/BANDAGES/DRESSINGS) IMPLANT
CUTTER BONE 4.0MM X 13CM (MISCELLANEOUS) ×1 IMPLANT
DISSECTOR  3.8MM X 13CM (MISCELLANEOUS)
DISSECTOR 3.8MM X 13CM (MISCELLANEOUS) IMPLANT
DRAPE IMP U-DRAPE 54X76 (DRAPES) ×1 IMPLANT
DRAPE INCISE IOBAN 66X45 STRL (DRAPES) ×1 IMPLANT
DRAPE ORTHO SPLIT 77X108 STRL (DRAPES) ×2
DRAPE STERI 35X30 U-POUCH (DRAPES) ×1 IMPLANT
DRAPE SURG ORHT 6 SPLT 77X108 (DRAPES) ×2 IMPLANT
DRAPE U-SHAPE 47X51 STRL (DRAPES) ×1 IMPLANT
DRSG EMULSION OIL 3X16 NADH (GAUZE/BANDAGES/DRESSINGS) IMPLANT
DRSG EMULSION OIL 3X3 NADH (GAUZE/BANDAGES/DRESSINGS) ×1 IMPLANT
DURAPREP 26ML APPLICATOR (WOUND CARE) ×1 IMPLANT
ELECT NDL TIP 2.8 STRL (NEEDLE) ×1 IMPLANT
ELECT NEEDLE TIP 2.8 STRL (NEEDLE) ×1 IMPLANT
ELECT REM PT RETURN 15FT ADLT (MISCELLANEOUS) ×1 IMPLANT
GAUZE PAD ABD 8X10 STRL (GAUZE/BANDAGES/DRESSINGS) IMPLANT
GAUZE SPONGE 4X4 12PLY STRL (GAUZE/BANDAGES/DRESSINGS) ×1 IMPLANT
GLOVE BIOGEL PI IND STRL 7.5 (GLOVE) ×2 IMPLANT
GLOVE BIOGEL PI IND STRL 8.5 (GLOVE) ×2 IMPLANT
GLOVE ORTHO TXT STRL SZ7.5 (GLOVE) ×1 IMPLANT
GLOVE SURG ORTHO 8.5 STRL (GLOVE) ×1 IMPLANT
GOWN STRL REUS W/ TWL LRG LVL3 (GOWN DISPOSABLE) ×3 IMPLANT
GOWN STRL REUS W/TWL LRG LVL3 (GOWN DISPOSABLE) ×3
KIT BASIN OR (CUSTOM PROCEDURE TRAY) ×1 IMPLANT
KIT TURNOVER KIT A (KITS) IMPLANT
NDL MAYO 6 CRC TAPER PT (NEEDLE) IMPLANT
NDL MAYO CATGUT SZ4 TPR NDL (NEEDLE) IMPLANT
NEEDLE MAYO 6 CRC TAPER PT (NEEDLE) ×1 IMPLANT
NEEDLE MAYO CATGUT SZ4 (NEEDLE) ×1 IMPLANT
NS IRRIG 1000ML POUR BTL (IV SOLUTION) ×1 IMPLANT
PACK ARTHROSCOPY WL (CUSTOM PROCEDURE TRAY) ×1 IMPLANT
PENCIL SMOKE EVACUATOR (MISCELLANEOUS) IMPLANT
PORT APPOLLO RF 90DEGREE MULTI (SURGICAL WAND) ×1 IMPLANT
RESTRAINT HEAD UNIVERSAL NS (MISCELLANEOUS) ×1 IMPLANT
SLING ULTRA II L (ORTHOPEDIC SUPPLIES) IMPLANT
SPIKE FLUID TRANSFER (MISCELLANEOUS) ×1 IMPLANT
SPONGE T-LAP 4X18 ~~LOC~~+RFID (SPONGE) IMPLANT
STAPLER VISISTAT 35W (STAPLE) IMPLANT
STRIP CLOSURE SKIN 1/2X4 (GAUZE/BANDAGES/DRESSINGS) IMPLANT
SUT BONE WAX W31G (SUTURE) IMPLANT
SUT MNCRL AB 4-0 PS2 18 (SUTURE) IMPLANT
SUT VIC AB 0 CT1 18XCR BRD 8 (SUTURE) IMPLANT
SUT VIC AB 0 CT1 27 (SUTURE)
SUT VIC AB 0 CT1 27XBRD ANBCTR (SUTURE) IMPLANT
SUT VIC AB 0 CT1 8-18 (SUTURE)
SUT VIC AB 0 CT2 27 (SUTURE) IMPLANT
SUT VIC AB 2-0 CT1 27 (SUTURE)
SUT VIC AB 2-0 CT1 TAPERPNT 27 (SUTURE) IMPLANT
SYR BULB IRRIG 60ML STRL (SYRINGE) ×1 IMPLANT
TOWEL OR 17X26 10 PK STRL BLUE (TOWEL DISPOSABLE) ×1 IMPLANT
TUBING ARTHROSCOPY IRRIG 16FT (MISCELLANEOUS) ×1 IMPLANT
TUBING CONNECTING 10 (TUBING) ×1 IMPLANT
YANKAUER SUCT BULB TIP NO VENT (SUCTIONS) IMPLANT

## 2022-01-30 NOTE — Anesthesia Procedure Notes (Signed)
Anesthesia Regional Block: Interscalene brachial plexus block   Pre-Anesthetic Checklist: , timeout performed,  Correct Patient, Correct Site, Correct Laterality,  Correct Procedure, Correct Position, site marked,  Risks and benefits discussed,  Pre-op evaluation,  At surgeon's request and post-op pain management  Laterality: Right  Prep: Maximum Sterile Barrier Precautions used, chloraprep       Needles:  Injection technique: Single-shot  Needle Type: Echogenic Stimulator Needle     Needle Length: 9cm  Needle Gauge: 21     Additional Needles:   Procedures:, nerve stimulator,,, ultrasound used (permanent image in chart),,     Nerve Stimulator or Paresthesia:  Response: Biceps response  Additional Responses:   Narrative:  Start time: 01/30/2022 2:43 PM End time: 01/30/2022 2:53 PM Injection made incrementally with aspirations every 5 mL. Anesthesiologist: Roderic Palau, MD  Additional Notes: Rescue block done in PACU.

## 2022-01-30 NOTE — Discharge Instructions (Addendum)
Ice to the shoulder constantly.  Keep the incision covered and clean and dry for one week, then ok to get it wet in the shower.  DO NOT Start Eliquis until tomorrow PM 01/31/2022 PM SATURDAY  Do exercise as instructed several times per day.  Pendulums - dangle the arm in circles Pillow slides - with the hand resting on a pillow slide your hand forward and back Gentle rotation - with arm on pillow and your elbow bent to 90 degrees sweep your arm in to your stomach and then out away from your body  DO NOT reach behind your back or push up out of a chair with the operative arm.  Use a sling while you are up and around for comfort, may remove while seated.  Keep pillow propped behind the operative elbow.  Follow up with Dr Veverly Fells in two weeks in the office, call [phone] for appt

## 2022-01-30 NOTE — Anesthesia Procedure Notes (Signed)
Anesthesia Regional Block: Interscalene brachial plexus block   Pre-Anesthetic Checklist: , timeout performed,  Correct Patient, Correct Site, Correct Laterality,  Correct Procedure, Correct Position, site marked,  Risks and benefits discussed,  Pre-op evaluation,  At surgeon's request and post-op pain management  Laterality: Right  Prep: Maximum Sterile Barrier Precautions used, chloraprep       Needles:  Injection technique: Single-shot  Needle Type: Echogenic Stimulator Needle     Needle Length: 5cm  Needle Gauge: 22     Additional Needles:   Procedures:,,,, ultrasound used (permanent image in chart),,    Narrative:  Start time: 01/30/2022 11:10 AM End time: 01/30/2022 11:20 AM Injection made incrementally with aspirations every 5 mL.  Performed by: Personally  Anesthesiologist: Roderic Palau, MD

## 2022-01-30 NOTE — Transfer of Care (Signed)
Immediate Anesthesia Transfer of Care Note  Patient: Wesley Harrington  Procedure(s) Performed: SHOULDER ARTHROSCOPY WITH SUBACROMIAL DECOMPRESSION AND OPEN ROTATOR CUFF REPAIR, OPEN BICEPS TENDON REPAIR, DISTAL CLAVICLE RESECTION AND SUBSCAPULAR EVALUATION (Right)  Patient Location: PACU  Anesthesia Type:General  Level of Consciousness: awake  Airway & Oxygen Therapy: Patient Spontanous Breathing and Patient connected to face mask oxygen  Post-op Assessment: Report given to RN and Post -op Vital signs reviewed and stable  Post vital signs: Reviewed and stable  Last Vitals:  Vitals Value Taken Time  BP 159/91 01/30/22 1427  Temp    Pulse 85 01/30/22 1428  Resp 19 01/30/22 1428  SpO2 100 % 01/30/22 1428  Vitals shown include unvalidated device data.  Last Pain:  Vitals:   01/30/22 1133  TempSrc:   PainSc: 7       Patients Stated Pain Goal: 4 (91/63/84 6659)  Complications: No notable events documented.

## 2022-01-30 NOTE — Anesthesia Procedure Notes (Signed)
Procedure Name: Intubation Date/Time: 01/30/2022 12:19 PM  Performed by: Cynda Familia, CRNAPre-anesthesia Checklist: Patient identified, Emergency Drugs available, Suction available and Patient being monitored Patient Re-evaluated:Patient Re-evaluated prior to induction Oxygen Delivery Method: Circle System Utilized Preoxygenation: Pre-oxygenation with 100% oxygen Induction Type: IV induction, Rapid sequence and Cricoid Pressure applied Ventilation: Mask ventilation without difficulty Laryngoscope Size: Miller and 2 Grade View: Grade I Tube type: Oral Tube size: 7.5 mm Number of attempts: 1 Airway Equipment and Method: Stylet Placement Confirmation: ETT inserted through vocal cords under direct vision, positive ETCO2 and breath sounds checked- equal and bilateral Secured at: 23 cm Tube secured with: Tape Dental Injury: Teeth and Oropharynx as per pre-operative assessment  Comments: Smooth IV induction Fitzgerald-- intubation AM CRNA atraumatic-- teeth and mouth as preop -- bilat BS Ola Spurr

## 2022-01-30 NOTE — Anesthesia Procedure Notes (Signed)
Date/Time: 01/30/2022 2:18 PM  Performed by: Cynda Familia, CRNAOxygen Delivery Method: Simple face mask Placement Confirmation: positive ETCO2 and breath sounds checked- equal and bilateral Dental Injury: Teeth and Oropharynx as per pre-operative assessment

## 2022-01-30 NOTE — Brief Op Note (Signed)
01/30/2022  2:18 PM  PATIENT:  Wesley Harrington  56 y.o. male  PRE-OPERATIVE DIAGNOSIS:  right shoulder rotator cuff tear, SLAP tear, AC DJD  POST-OPERATIVE DIAGNOSIS:  right shoulder rotator cuff tear, SLAP tear, AC DJD  PROCEDURE:  Procedure(s) with comments: SHOULDER ARTHROSCOPY WITH SUBACROMIAL DECOMPRESSION AND OPEN ROTATOR CUFF REPAIR, OPEN BICEPS TENDON REPAIR, DISTAL CLAVICLE RESECTION AND SUBSCAPULAR EVALUATION (Right) - CHOICE WITH INTERSCALENE BLOCK   SURGEON:  Surgeon(s) and Role:    Netta Cedars, MD - Primary  PHYSICIAN ASSISTANT:   ASSISTANTS: Ventura Bruns, PA-C   ANESTHESIA:   regional and general  EBL:  75 mL   BLOOD ADMINISTERED:none  DRAINS: none   LOCAL MEDICATIONS USED:  MARCAINE     SPECIMEN:  No Specimen  DISPOSITION OF SPECIMEN:  N/A  COUNTS:  YES  TOURNIQUET:  * No tourniquets in log *  DICTATION: .Other Dictation: Dictation Number [id number]  PLAN OF CARE: Discharge to home after PACU  PATIENT DISPOSITION:  PACU - hemodynamically stable.   Delay start of Pharmacological VTE agent (>24hrs) due to surgical blood loss or risk of bleeding: not applicable

## 2022-01-30 NOTE — Op Note (Signed)
NAME: Wesley Harrington, Wesley Harrington MEDICAL RECORD NO: [id number] ACCOUNT NO: [id number] DATE OF BIRTH: 1965/03/07 FACILITY: [facility] LOCATION: [facility] PHYSICIAN: Doran Heater. Veverly Fells, MD  Operative Report   DATE OF PROCEDURE: 01/30/2022  PREOPERATIVE DIAGNOSES:  Right shoulder rotator cuff tear, SLAP tear and acromioclavicular arthritis.  POSTOPERATIVE DIAGNOSES:  Right shoulder rotator cuff tear, SLAP tear and acromioclavicular arthritis.  PROCEDURE PERFORMED:  Right shoulder arthroscopy with extensive intraarticular debridement of torn superior labrum anterior to posterior with arthroscopic biceps tenotomy, arthroscopic subacromial decompression with mini open rotator cuff repair, open  biceps tenodesis and open DCR.  ANESTHESIA:  General anesthesia was used.  ATTENDING SURGEON:  Doran Heater. Veverly Fells, MD  ASSISTANT:  Charletta Cousin Curtner.  ANESTHESIA:  We also did an interscalene block.  ESTIMATED BLOOD LOSS:  Minimal.  FLUID REPLACEMENT:  1000 mL crystalloid.  COUNTS:  Instrument counts correct.  COMPLICATIONS:  No complications.  ANTIBIOTICS:  Perioperative antibiotics were given.  INDICATIONS:  The patient is a 57 year old male presents with a history of worsening right shoulder pain due to MRI documented rotator cuff tear.  The patient has failed an extended period of conservative management, presents for operative treatment.   The patient also has symptomatic AC joint arthritis and probable SLAP tear with a split tear in the biceps.  Having failed conservative management the patient desires operative treatment.  Informed consent obtained.  DESCRIPTION OF PROCEDURE:  After an adequate level of anesthesia was achieved, the patient was positioned in the modified beach chair position.  Right shoulder correctly identified and sterilely prepped and draped in the usual manner.  Timeout called,  verifying correct patient, correct site, we entered the patient's shoulder using standard arthroscopic portals  including anterior, posterior and lateral portals.  The patient had extensive tearing of the superior labrum extending into the biceps.  The  patient had about 50% tear of the biceps tendon.  We performed a biceps tenotomy with basket forceps and motorized shaver.  We also removed the entirety of the superior labrum as it was completely damaged.  There was some debridement of the inferior  labrum as well as there was a small nondisplaced tear.  Articular cartilage was in good shape with minimal chondromalacia.  Subscap was intact.  Supraspinatus and infraspinatus were torn.  The teres minor was intact.  We reversed the scope looking  posteriorly a little bit debrided the posterior labrum and then placed the scope in subacromial space.  A bursectomy was performed followed by an acromioplasty, creating a type 1 acromial shape with removing spurs anteriorly and laterally.  We inspected  the rotator cuff from the bursal surface and we could see the tear involving the posterior supraspinatus and the anterior infraspinatus.  This was a deep V-shaped type tear.  At this point, we concluded the arthroscopic portion of procedure.  We then  made a small saber incision overlying the AC joint.  Dissection down through the subcutaneous tissues.  The deltotrapezial fascia was identified, incised in line with distal clavicle.  Subperiosteal dissection of distal clavicle performed followed by  excision of distal 2 mm of bone using an oscillating saw. We removed large osteophytes off the dorsal acromion and the dorsal medial clavicle fragment.  We also removed the hypertrophied capsule.  We irrigated thoroughly applied bone wax to the cut in  the clavicle.  We then repaired the deltotrapezial fascia anatomically with 0 Vicryl suture followed by 2-0 Vicryl for subcutaneous closure and 4-0 Monocryl for skin.  We addressed the rotator  cuff from the biceps through a single mini open incision  starting at the anterolateral border  of acromion staying distally about 4 cm in the raphae between the anterior and lateral heads of the deltoid.  Dissection down through subcutaneous tissue, we split the deltoid raphae we placed Arthrex retractor,  identified the biceps groove delivered the biceps tendon out of the biceps sheath whipstitched that with #2 Hi-Fi suture reinforced tendon.  We then prepped the floor of the biceps groove with a needle tip Bovie and a Freer elevator getting down to  bleeding bone.  We took a single Y-Knot flex anchor and placed that through the biceps groove floor brought the suture up through the reinforced tendon in mattress fashion, tying it down flush. We took the longitudinal whipstitch sutures up through the  rotator interval and tied over soft tissue bridge incorporating part of the subscap.  We oversewed the tenodesis site at the top with 0 Vicryl figure-of-eight suture x4.  We had a really nice low profile tenodesis with a solid repair.  We went to the  cuff tear.  Basically, this is a V shaped tear amenable for margin convergence.  We freshened up the anterior and posterior limbs of the cuff.  We were able to mobilize the tendon very well and so we rongeured the greater tuberosity to get down to  bleeding bone.  We took a single Y-Knot RC anchor double loaded with ribbon and placed at the medial part of the footprint and we brought that out through the posterior portion of the cuff and also to the anterior and posterior portion of the cuff.  We  did side-to-side with #2 Hi-Fi, we were able to get an anatomic repair full thickness front to back, mostly with a little bit of medial to lateral translation of that posterior portion of the cuff.  We also did went the sutures down through drill holes  out laterally, but we had a watertight repair that was double row.  At this point, with the rotator cuff repair we irrigated thoroughly, we ranged the shoulder, no impingement was noted followed by the deltoid repair  with the 0 Vicryl suture followed by  2-0 Vicryl for subcutaneous closure and 4-0 Monocryl for skin.  Steri-Strips applied followed by sterile dressing.  The patient tolerated surgery well.   PUS D: 01/30/2022 2:27:26 pm T: 01/30/2022 7:14:00 pm  JOB: [id number]/ [id number]

## 2022-01-30 NOTE — Anesthesia Postprocedure Evaluation (Signed)
Anesthesia Post Note  Patient: Wesley Harrington  Procedure(s) Performed: SHOULDER ARTHROSCOPY WITH SUBACROMIAL DECOMPRESSION AND OPEN ROTATOR CUFF REPAIR, OPEN BICEPS TENDON REPAIR, DISTAL CLAVICLE RESECTION AND SUBSCAPULAR EVALUATION (Right)     Patient location during evaluation: PACU Anesthesia Type: General and Regional Level of consciousness: awake and alert Pain management: pain level controlled Vital Signs Assessment: post-procedure vital signs reviewed and stable Respiratory status: spontaneous breathing, nonlabored ventilation, respiratory function stable and patient connected to nasal cannula oxygen Cardiovascular status: blood pressure returned to baseline and stable Postop Assessment: no apparent nausea or vomiting Anesthetic complications: no  No notable events documented.  Last Vitals:  Vitals:   01/30/22 1445 01/30/22 1500  BP: (!) 134/94 (!) 141/84  Pulse: 88 75  Resp: 17 (!) 27  Temp:    SpO2: 96% 94%    Last Pain:  Vitals:   01/30/22 1430  TempSrc:   PainSc: 3                  Jasan Doughtie,W. EDMOND

## 2022-01-30 NOTE — Interval H&P Note (Signed)
History and Physical Interval Note:  01/30/2022 10:28 AM  Wesley Harrington  has presented today for surgery, with the diagnosis of right shoulder rotator cuff tear.  The various methods of treatment have been discussed with the patient and family. After consideration of risks, benefits and other options for treatment, the patient has consented to  Procedure(s) with comments: SHOULDER ARTHROSCOPY WITH SUBACROMIAL DECOMPRESSION AND OPEN ROTATOR CUFF REPAIR, OPEN BICEPS TENDON REPAIR, DISTAL CLAVICLE RESECTION AND SUBSCAPULAR EVALUATION (Right) - CHOICE WITH INTERSCALENE BLOCK as a surgical intervention.  The patient's history has been reviewed, patient examined, no change in status, stable for surgery.  I have reviewed the patient's chart and labs.  Questions were answered to the patient's satisfaction.     [name]

## 2022-01-31 ENCOUNTER — Other Ambulatory Visit (HOSPITAL_COMMUNITY): Payer: Self-pay

## 2022-01-31 DIAGNOSIS — M75101 Unspecified rotator cuff tear or rupture of right shoulder, not specified as traumatic: Secondary | ICD-10-CM | POA: Diagnosis not present

## 2022-01-31 MED ORDER — HYDROMORPHONE HCL 2 MG PO TABS
2.0000 mg | ORAL_TABLET | ORAL | 0 refills | Status: AC | PRN
  Filled 2022-01-31: qty 30, 3d supply, fill #0

## 2022-01-31 MED ORDER — HYDROMORPHONE HCL 2 MG PO TABS: 2.0000 mg | ORAL_TABLET | ORAL | 0 refills | Status: DC | PRN

## 2022-01-31 MED ORDER — HYDROMORPHONE HCL 2 MG PO TABS
2.0000 mg | ORAL_TABLET | ORAL | Status: DC | PRN
  Administered 2022-01-31 (×2): 4 mg via ORAL
  Filled 2022-01-31 (×2): qty 2

## 2022-01-31 NOTE — Discharge Summary (Signed)
Patient ID: Wesley Harrington MRN: [id number] DOB/AGE: 1965-08-10 57 y.o.  Admit date: 01/30/2022 Discharge date: 01/31/2022  Admission Diagnoses:  Principal Problem:   History of arthroscopy of right shoulder   Discharge Diagnoses:  Same  Past Medical History:  Diagnosis Date   Arthritis    Cyst    groin cyst   Dysrhythmia    Hyperlipidemia    Hypertension    Perirectal abscess    Pneumonia     Surgeries: Procedure(s): SHOULDER ARTHROSCOPY WITH SUBACROMIAL DECOMPRESSION AND OPEN ROTATOR CUFF REPAIR, OPEN BICEPS TENDON REPAIR, DISTAL CLAVICLE RESECTION AND SUBSCAPULAR EVALUATION on 01/30/2022   Consultants:   Discharged Condition: Improved  Hospital Course: Wesley Harrington is an 57 y.o. male who was admitted 01/30/2022 for operative treatment ofHistory of arthroscopy of right shoulder. Patient has severe unremitting pain that affects sleep, daily activities, and work/hobbies. After pre-op clearance the patient was taken to the operating room on 01/30/2022 and underwent  Procedure(s): SHOULDER ARTHROSCOPY WITH SUBACROMIAL DECOMPRESSION AND OPEN ROTATOR CUFF REPAIR, OPEN BICEPS TENDON REPAIR, DISTAL CLAVICLE RESECTION AND SUBSCAPULAR EVALUATION.    Patient was given perioperative antibiotics:  Anti-infectives (From admission, onward)    Start     Dose/Rate Route Frequency Ordered Stop   01/30/22 1800  ceFAZolin (ANCEF) IVPB 2g/100 mL premix        2 g 200 mL/hr over 30 Minutes Intravenous Every 6 hours 01/30/22 1716 01/31/22 0543   01/30/22 0945  ceFAZolin (ANCEF) IVPB 2g/100 mL premix        2 g 200 mL/hr over 30 Minutes Intravenous On call to O.R. 01/30/22 0944 01/30/22 1250        Patient was given sequential compression devices, early ambulation, and chemoprophylaxis to prevent DVT.  Patient benefited maximally from hospital stay and there were no complications.    Recent vital signs: Patient Vitals for the past 24 hrs:  BP Temp Temp src Pulse Resp SpO2 Height Weight   01/31/22 0755 -- -- -- -- -- 96 % -- --  01/31/22 0541 (!) 132/93 97.9 F (36.6 C) -- 91 17 94 % -- --  01/31/22 0147 138/86 98.4 F (36.9 C) Oral 71 16 97 % -- --  01/31/22 0137 -- -- -- -- -- -- 6\' 3"  (1.905 m) 118 kg  01/30/22 2156 138/88 98.1 F (36.7 C) Oral 88 16 90 % -- --  01/30/22 1717 (!) 146/94 97.6 F (36.4 C) Oral 82 17 96 % -- --  01/30/22 1600 (!) 137/90 -- -- 84 13 95 % -- --  01/30/22 1545 (!) 144/85 -- -- 73 14 94 % -- --  01/30/22 1530 (!) 142/86 -- -- 75 16 94 % -- --  01/30/22 1515 137/78 -- -- 81 12 94 % -- --  01/30/22 1500 (!) 141/84 -- -- 75 (!) 27 94 % -- --  01/30/22 1445 (!) 134/94 -- -- 88 17 96 % -- --  01/30/22 1430 (!) 143/82 97.7 F (36.5 C) -- 84 15 100 % -- --  01/30/22 1133 123/75 -- -- 82 16 93 % -- --  01/30/22 1128 134/86 -- -- 74 14 96 % -- --  01/30/22 1123 (!) 140/74 -- -- 79 12 90 % -- --  01/30/22 1118 (!) 132/97 -- -- 74 14 93 % -- --  01/30/22 1113 (!) 160/92 -- -- 75 13 95 % -- --  01/30/22 1003 (!) 176/96 98.2 F (36.8 C) Oral 88 18 92 % -- --  Recent laboratory studies: No results for input(s): "WBC", "HGB", "HCT", "PLT", "NA", "K", "CL", "CO2", "BUN", "CREATININE", "GLUCOSE", "INR", "CALCIUM" in the last 72 hours.  Invalid input(s): "PT", "2"   Discharge Medications:   Allergies as of 01/31/2022       Reactions   Elemental Sulfur Other (See Comments)   Dropped body temp. Felt cold.   Sulfa Antibiotics Other (See Comments)        Medication List     TAKE these medications    albuterol 108 (90 Base) MCG/ACT inhaler Commonly known as: VENTOLIN HFA Inhale 2 puffs into the lungs every 6 (six) hours as needed for wheezing or shortness of breath.   apixaban 5 MG Tabs tablet Commonly known as: ELIQUIS Take 5 mg by mouth 2 (two) times daily.   aspirin EC 81 MG tablet Take 81 mg by mouth daily.   diltiazem 240 MG 24 hr capsule Commonly known as: CARDIZEM CD Take 240 mg by mouth daily.   Emergen-C Vitamin C  Chew Chew 2 tablets by mouth 2 (two) times daily. chewable   esomeprazole 20 MG capsule Commonly known as: NEXIUM Take 20 mg by mouth in the morning.   ezetimibe 10 MG tablet Commonly known as: ZETIA Take 10 mg by mouth daily.   flecainide 50 MG tablet Commonly known as: TAMBOCOR Take 50 mg by mouth 2 (two) times daily.   HYDROmorphone 2 MG tablet Commonly known as: DILAUDID Take 1-2 tablets (2-4 mg total) by mouth every 3 (three) hours as needed for severe pain (severe breakthrough pain).   lisinopril 20 MG tablet Commonly known as: ZESTRIL Take 20 mg by mouth daily.   loratadine 10 MG tablet Commonly known as: CLARITIN Take 10 mg by mouth daily.   LORazepam 0.5 MG tablet Commonly known as: ATIVAN Take 0.5 mg by mouth every 8 (eight) hours as needed for anxiety.   methocarbamol 500 MG tablet Commonly known as: ROBAXIN Take 1 tablet (500 mg total) by mouth every 8 (eight) hours as needed for muscle spasms.   multivitamin tablet Take 1 tablet by mouth daily.   oxyCODONE 15 MG immediate release tablet Commonly known as: ROXICODONE Take 15 mg by mouth every 6 (six) hours as needed for pain.   testosterone cypionate 200 MG/ML injection Commonly known as: DEPOTESTOSTERONE CYPIONATE Inject 200 mg into the muscle every 14 (fourteen) days.   Trelegy Ellipta 200-62.5-25 MCG/ACT Aepb Generic drug: Fluticasone-Umeclidin-Vilant Inhale 1 puff into the lungs daily. What changed:  when to take this reasons to take this        Diagnostic Studies: No results found.  Disposition: Discharge disposition: 01-Home or Self Care       Discharge Instructions     Call MD / Call 911   Complete by: As directed    If you experience chest pain or shortness of breath, CALL 911 and be transported to the hospital emergency room.  If you develope a fever above 101 F, pus (white drainage) or increased drainage or redness at the wound, or calf pain, call your surgeon's office.    Constipation Prevention   Complete by: As directed    Drink plenty of fluids.  Prune juice may be helpful.  You may use a stool softener, such as Colace (over the counter) 100 mg twice a day.  Use MiraLax (over the counter) for constipation as needed.   Diet - low sodium heart healthy   Complete by: As directed    Increase activity slowly as  tolerated   Complete by: As directed    Post-operative opioid taper instructions:   Complete by: As directed    POST-OPERATIVE OPIOID TAPER INSTRUCTIONS: It is important to wean off of your opioid medication as soon as possible. If you do not need pain medication after your surgery it is ok to stop day one. Opioids include: Codeine, Hydrocodone(Norco, Vicodin), Oxycodone(Percocet, oxycontin) and hydromorphone amongst others.  Long term and even short term use of opiods can cause: Increased pain response Dependence Constipation Depression Respiratory depression And more.  Withdrawal symptoms can include Flu like symptoms Nausea, vomiting And more Techniques to manage these symptoms Hydrate well Eat regular healthy meals Stay active Use relaxation techniques(deep breathing, meditating, yoga) Do Not substitute Alcohol to help with tapering If you have been on opioids for less than two weeks and do not have pain than it is ok to stop all together.  Plan to wean off of opioids This plan should start within one week post op of your joint replacement. Maintain the same interval or time between taking each dose and first decrease the dose.  Cut the total daily intake of opioids by one tablet each day Next start to increase the time between doses. The last dose that should be eliminated is the evening dose.           Follow-up Information     Beverely Low, MD Follow up in 2 day(s).   Specialty: Orthopedic Surgery Why: call [phone] for appt Contact information: [address] [place] [address] [phone]                   Signed: Dorothy Spark 01/31/2022, 8:03 AM

## 2022-01-31 NOTE — TOC CM/SW Note (Signed)
Transition of Care [facility]) Screening Note  Patient Details  Name: Wesley Harrington Date of Birth: 04-Dec-1965  Transition of Care [facility]) CM/SW Contact:    Sherie Don, LCSW Phone Number: 01/31/2022, 9:07 AM  Transition of Care Department [facility]) has reviewed patient and no TOC needs have been identified at this time. We will continue to monitor patient advancement through interdisciplinary progression rounds. If new patient transition needs arise, please place a TOC consult.

## 2022-01-31 NOTE — Progress Notes (Signed)
The patient is alert and oriented and has been seen by his physician. The orders for discharge were written. IV has been removed. Dressing is clean, dry, and intact. Went over discharge instructions with patient and family. He is being walked down to the discharge lounge by RN with all of his belongings.

## 2022-01-31 NOTE — Plan of Care (Signed)
  Problem: Education: Goal: Knowledge of General Education information will improve Description: Including pain rating scale, medication(s)/side effects and non-pharmacologic comfort measures Outcome: Progressing

## 2022-01-31 NOTE — Evaluation (Signed)
Occupational Therapy Evaluation Patient Details Name: Wesley Harrington MRN: [id number] DOB: Jul 30, 1965 Today's Date: 01/31/2022   History of Present Illness Patient is a 57 year old male who presented with Right shoulder rotator cuff tear, SLAP tear and acromioclavicular arthritis. Patient underwent Right shoulder arthroscopy with extensive intraarticular debridement of torn superior labrum anterior to posterior with arthroscopic biceps tenotomy, arthroscopic subacromial decompression with mini open rotator cuff repair, open   biceps tenodesis on 1/19. PMH: hyperlipidemia, HTN, cyst   Clinical Impression   Patient is a 57 year old male who is s/p shoulder replacement without functional use of right dominant upper extremity secondary to effects of surgery and interscalene block and shoulder precautions. Therapist provided education and instruction to patient and spouse in regards to exercises, precautions, positioning, donning upper extremity clothing and bathing while maintaining shoulder precautions, ice and edema management and donning/doffing sling. Patient and spouse verbalized understanding and demonstrated as needed. Patient needed assistance to donn shirt, underwear, pants, socks and shoes and provided with instruction on compensatory strategies to perform ADLs. Patient to follow up with MD for further therapy needs.        Recommendations for follow up therapy are one component of a multi-disciplinary discharge planning process, led by the attending physician.  Recommendations may be updated based on patient status, additional functional criteria and insurance authorization.   Follow Up Recommendations  Follow physician's recommendations for discharge plan and follow up therapies     Assistance Recommended at Discharge Frequent or constant Supervision/Assistance  Patient can return home with the following A little help with bathing/dressing/bathroom;Help with stairs or ramp for  entrance;Assistance with cooking/housework;Assist for transportation    Functional Status Assessment  Patient has had a recent decline in their functional status and demonstrates the ability to make significant improvements in function in a reasonable and predictable amount of time.  Equipment Recommendations  None recommended by OT       Precautions / Restrictions Precautions Precautions: Shoulder Type of Shoulder Precautions: no ROM shoulder, ok for hand wrist and elbow AROM Shoulder Interventions: Shoulder sling/immobilizer;Shoulder abduction pillow;At all times;Off for dressing/bathing/exercises Precaution Booklet Issued: Yes (comment) (handouts) Restrictions Weight Bearing Restrictions: Yes RUE Weight Bearing: Non weight bearing      Mobility Bed Mobility Overal bed mobility: Modified Independent        Transfers Overall transfer level: Modified independent          Balance Overall balance assessment: No apparent balance deficits (not formally assessed)           ADL either performed or assessed with clinical judgement   ADL Overall ADL's : Needs assistance/impaired Eating/Feeding: Modified independent   Grooming: Supervision/safety   Upper Body Bathing: Moderate assistance;Sitting Upper Body Bathing Details (indicate cue type and reason): education provided to patient and wife with simulated task. Lower Body Bathing: Minimal assistance;Sitting/lateral leans   Upper Body Dressing : Moderate assistance;Sitting   Lower Body Dressing: Maximal assistance;Sit to/from stand Lower Body Dressing Details (indicate cue type and reason): wife able to assist with donning pants socks and shoes. Toilet Transfer: Supervision/safety;Ambulation Toilet Transfer Details (indicate cue type and reason): with no AD and no LOB Toileting- Clothing Manipulation and Hygiene: Min guard Toileting - Clothing Manipulation Details (indicate cue type and reason): education on using  toileting buddy, bidet and LUE             Vision Baseline Vision/History: 1 Wears glasses  Pertinent Vitals/Pain Pain Assessment Pain Assessment: Faces Faces Pain Scale: Hurts little more Pain Location: R shoulder Pain Descriptors / Indicators: Burning Pain Intervention(s): Premedicated before session, Monitored during session, Limited activity within patient's tolerance, Repositioned, Ice applied     Hand Dominance Right   Extremity/Trunk Assessment Upper Extremity Assessment Upper Extremity Assessment: RUE deficits/detail RUE Deficits / Details: s/p surgical repair with orders for NO ROM shoulder, OK for hand wrist and elbow. numbness in whole UE, able to wiggle digits. RUE: Unable to fully assess due to immobilization   Lower Extremity Assessment Lower Extremity Assessment: Overall WFL for tasks assessed   Cervical / Trunk Assessment Cervical / Trunk Assessment: Normal   Communication Communication Communication: No difficulties   Cognition Arousal/Alertness: Awake/alert Behavior During Therapy: WFL for tasks assessed/performed Overall Cognitive Status: Within Functional Limits for tasks assessed       General Comments: wife present as well.           Shoulder Instructions Shoulder Instructions Donning/doffing shirt without moving shoulder: Caregiver independent with task Method for sponge bathing under operated UE: Caregiver independent with task Donning/doffing sling/immobilizer: Caregiver independent with task Correct positioning of sling/immobilizer: Caregiver independent with task ROM for elbow, wrist and digits of operated UE: Modified independent Sling wearing schedule (on at all times/off for ADL's): Caregiver independent with task Proper positioning of operated UE when showering: Caregiver independent with task Positioning of UE while sleeping: Caregiver independent with task    Home Living Family/patient expects to be discharged  to:: Private residence Living Arrangements: Spouse/significant other Available Help at Discharge: Family;Available 24 hours/day Type of Home: House          Prior Functioning/Environment Prior Level of Function : Independent/Modified Independent                        OT Problem List: Decreased activity tolerance;Pain;Impaired UE functional use;Decreased knowledge of precautions;Decreased safety awareness      OT Treatment/Interventions: Self-care/ADL training;Energy conservation;Therapeutic exercise;DME and/or AE instruction;Therapeutic activities;Patient/family education;Balance training    OT Goals(Current goals can be found in the care plan section) Acute Rehab OT Goals Patient Stated Goal: to get better OT Goal Formulation: With patient Time For Goal Achievement: 02/14/22 Potential to Achieve Goals: Fair  OT Frequency: Min 1X/week       AM-PAC OT "6 Clicks" Daily Activity     Outcome Measure Help from another person eating meals?: None Help from another person taking care of personal grooming?: A Little Help from another person toileting, which includes using toliet, bedpan, or urinal?: A Little Help from another person bathing (including washing, rinsing, drying)?: A Lot Help from another person to put on and taking off regular upper body clothing?: A Lot Help from another person to put on and taking off regular lower body clothing?: A Lot 6 Click Score: 16   End of Session Equipment Utilized During Treatment: Other (comment) (sling) Nurse Communication: Mobility status  Activity Tolerance: Patient tolerated treatment well Patient left: in bed;with call bell/phone within reach  OT Visit Diagnosis: Muscle weakness (generalized) (M62.81);Pain Pain - Right/Left: Right Pain - part of body: Shoulder                Time: 9562-1308 OT Time Calculation (min): 44 min Charges:  OT General Charges $OT Visit: 1 Visit OT Evaluation $OT Eval Low Complexity: 1  Low OT Treatments $Self Care/Home Management : 23-37 mins  Rennie Plowman, MS Acute Rehabilitation Department Office# [phone]   [facility]  Billey Chang 01/31/2022, 10:01 AM

## 2022-01-31 NOTE — Progress Notes (Signed)
Subjective: 1 Day Post-Op Procedure(s) (LRB): SHOULDER ARTHROSCOPY WITH SUBACROMIAL DECOMPRESSION AND OPEN ROTATOR CUFF REPAIR, OPEN BICEPS TENDON REPAIR, DISTAL CLAVICLE RESECTION AND SUBSCAPULAR EVALUATION (Right) Patient reports pain as moderate.  Reports pain despite oxy 15mg  (has been on chronically for pain mgmt). Did take dilaudid overnight w some relief. Still noting some effects of the block. No other c/o.  Objective: Vital signs in last 24 hours: Temp:  [97.6 F (36.4 C)-98.4 F (36.9 C)] 97.9 F (36.6 C) (01/20 0541) Pulse Rate:  [71-91] 91 (01/20 0541) Resp:  [12-27] 17 (01/20 0541) BP: (123-176)/(74-97) 132/93 (01/20 0541) SpO2:  [90 %-100 %] 96 % (01/20 0755) Weight:  [762 kg] 118 kg (01/20 0137)  Intake/Output from previous day: 01/19 0701 - 01/20 0700 In: 3351 [P.O.:960; I.V.:1936; IV Piggyback:455] Out: 2633 [Urine:1300; Blood:75] Intake/Output this shift: No intake/output data recorded.  No results for input(s): "HGB" in the last 72 hours. No results for input(s): "WBC", "RBC", "HCT", "PLT" in the last 72 hours. No results for input(s): "NA", "K", "CL", "CO2", "BUN", "CREATININE", "GLUCOSE", "CALCIUM" in the last 72 hours. No results for input(s): "LABPT", "INR" in the last 72 hours.  Neurologically intact ABD soft Neurovascular intact Sensation intact distally Intact pulses distally Dorsiflexion/Plantar flexion intact Incision: dressing C/D/I and no drainage No cellulitis present Compartment soft No sign of DVT   Assessment/Plan: 1 Day Post-Op Procedure(s) (LRB): SHOULDER ARTHROSCOPY WITH SUBACROMIAL DECOMPRESSION AND OPEN ROTATOR CUFF REPAIR, OPEN BICEPS TENDON REPAIR, DISTAL CLAVICLE RESECTION AND SUBSCAPULAR EVALUATION (Right) Advance diet Up with therapy D/C IV fluids Add PO dilaudid for breakthrough pain. As long as pain remains well controlled with that can d/c home today Discussed with Dr Veverly Fells   Cecilie Kicks 01/31/2022, 7:57 AM

## 2022-02-02 ENCOUNTER — Encounter (HOSPITAL_COMMUNITY): Payer: Self-pay | Admitting: Orthopedic Surgery

## 2022-02-17 ENCOUNTER — Other Ambulatory Visit (HOSPITAL_COMMUNITY): Payer: Self-pay

## 2022-02-17 MED ORDER — HYDROMORPHONE HCL 2 MG PO TABS
2.0000 mg | ORAL_TABLET | ORAL | 0 refills | Status: AC | PRN
  Filled 2022-02-17 (×2): qty 30, 5d supply, fill #0

## 2022-08-14 ENCOUNTER — Other Ambulatory Visit: Payer: Self-pay | Admitting: Pulmonary Disease

## 2022-10-25 ENCOUNTER — Other Ambulatory Visit: Payer: Self-pay | Admitting: Pulmonary Disease

## 2023-01-18 ENCOUNTER — Encounter (HOSPITAL_COMMUNITY): Payer: Self-pay | Admitting: *Deleted

## 2023-01-18 ENCOUNTER — Other Ambulatory Visit: Payer: Self-pay

## 2023-01-18 ENCOUNTER — Emergency Department (HOSPITAL_COMMUNITY): Payer: BC Managed Care – PPO

## 2023-01-18 ENCOUNTER — Emergency Department (HOSPITAL_COMMUNITY)
Admission: EM | Admit: 2023-01-18 | Discharge: 2023-01-18 | Discharge status: Against Medical Advice | Payer: BC Managed Care – PPO | Attending: Emergency Medicine | Admitting: Emergency Medicine

## 2023-01-18 DIAGNOSIS — Z5321 Procedure and treatment not carried out due to patient leaving prior to being seen by health care provider: Secondary | ICD-10-CM | POA: Diagnosis not present

## 2023-01-18 DIAGNOSIS — I10 Essential (primary) hypertension: Secondary | ICD-10-CM | POA: Diagnosis not present

## 2023-01-18 DIAGNOSIS — Z7901 Long term (current) use of anticoagulants: Secondary | ICD-10-CM | POA: Insufficient documentation

## 2023-01-18 DIAGNOSIS — R0789 Other chest pain: Secondary | ICD-10-CM | POA: Insufficient documentation

## 2023-01-18 DIAGNOSIS — F17210 Nicotine dependence, cigarettes, uncomplicated: Secondary | ICD-10-CM | POA: Insufficient documentation

## 2023-01-18 DIAGNOSIS — I4891 Unspecified atrial fibrillation: Secondary | ICD-10-CM | POA: Diagnosis not present

## 2023-01-18 DIAGNOSIS — R079 Chest pain, unspecified: Secondary | ICD-10-CM | POA: Diagnosis present

## 2023-01-18 LAB — BASIC METABOLIC PANEL
Anion gap: 12 (ref 5–15)
BUN: 22 mg/dL — ABNORMAL HIGH (ref 6–20)
CO2: 25 mmol/L (ref 22–32)
Calcium: 9.1 mg/dL (ref 8.9–10.3)
Chloride: 99 mmol/L (ref 98–111)
Creatinine, Ser: 1.21 mg/dL (ref 0.61–1.24)
GFR, Estimated: >60 mL/min (ref >60)
Glucose, Bld: 118 mg/dL — ABNORMAL HIGH (ref 70–99)
Potassium: 4.2 mmol/L (ref 3.5–5.1)
Sodium: 136 mmol/L (ref 135–145)

## 2023-01-18 LAB — CBC WITH DIFFERENTIAL/PLATELET
Abs Immature Granulocytes: 0.05 10*3/uL (ref 0.00–0.07)
Basophils Absolute: 0.1 10*3/uL (ref 0.0–0.1)
Basophils Relative: 1 %
Eosinophils Absolute: 0.2 10*3/uL (ref 0.0–0.5)
Eosinophils Relative: 2 %
HCT: 44.3 % (ref 39.0–52.0)
Hemoglobin: 14.8 g/dL (ref 13.0–17.0)
Immature Granulocytes: 1 %
Lymphocytes Relative: 20 %
Lymphs Abs: 1.8 10*3/uL (ref 0.7–4.0)
MCH: 31.1 pg (ref 26.0–34.0)
MCHC: 33.4 g/dL (ref 30.0–36.0)
MCV: 93.1 fL (ref 80.0–100.0)
Monocytes Absolute: 1 10*3/uL (ref 0.1–1.0)
Monocytes Relative: 11 %
Neutro Abs: 5.8 10*3/uL (ref 1.7–7.7)
Neutrophils Relative %: 65 %
Platelets: 218 10*3/uL (ref 150–400)
RBC: 4.76 MIL/uL (ref 4.22–5.81)
RDW: 14 % (ref 11.5–15.5)
WBC: 8.8 10*3/uL (ref 4.0–10.5)
nRBC: 0 % (ref 0.0–0.2)

## 2023-01-18 LAB — TROPONIN I (HIGH SENSITIVITY): Troponin I (High Sensitivity): 4 ng/L (ref <18)

## 2023-01-18 MED ORDER — ASPIRIN 81 MG PO CHEW: 324.0000 mg | CHEWABLE_TABLET | Freq: Once | ORAL | Status: DC

## 2023-01-18 NOTE — ED Provider Triage Note (Signed)
 Emergency Medicine Provider Triage Evaluation Note  Wesley Harrington , a 58 y.o. male  was evaluated in triage.  Pt complains of left-sided sharp chest pain in the left parasternal border, started at 2 AM, he woke up at 4:00 to go to work, if pain was continuing, he has worsening pain with deep breathing, no fevers or chills, no nausea or vomiting, no swelling of the legs, he is treated for A-fib and is on Eliquis , treated for hypertension, he still smokes cigarettes.  He has never had coronary disease that he knows of.  Review of Systems  Positive: Chest pain Negative: Fevers chills and swelling of the legs  Physical Exam  BP 133/83 (BP Location: Left Arm)   Pulse 80   Temp 97.8 F (36.6 C)   Resp 15   SpO2 99%  Gen:   Awake, no distress   Resp:  Normal effort  MSK:   Moves extremities without difficulty no edema Other:  Cardiac exam without murmurs rubs or gallops, normal pulses  Medical Decision Making  Medically screening exam initiated at 9:27 AM.  Appropriate orders placed.  TAVEN STRITE was informed that the remainder of the evaluation will be completed by another provider, this initial triage assessment does not replace that evaluation, and the importance of remaining in the ED until their evaluation is complete.  Patient is otherwise well-appearing, pleuritic chest pain left-sided the chest, atypical for coronary disease however the patient has multiple risk factors including age of 86, hypertension, tobacco use, known A-fib.  Will check EKG, likely needs troponin rule out, chest x-ray to rule out pneumothorax, the patient is agreeable.  I did counsel the patient on his tobacco use.   Cleotilde Rogue, MD 01/18/23 [phone]

## 2023-01-18 NOTE — ED Notes (Signed)
 Pt stated they were leaving and were going to see their PCP, sort tech given stickers and wristband

## 2023-01-18 NOTE — ED Triage Notes (Signed)
 PT states Saturday had some right flank sharp pains, that did resolved. Then developed nonradiating left chest area pain. Pt states pain with deep breathing in chest. HX of Afib

## 2023-03-18 ENCOUNTER — Encounter: Payer: Self-pay | Admitting: Pulmonary Disease
